# Patient Record
Sex: Female | Born: 1960 | Race: White | Hispanic: No | Marital: Married | Smoking: Former smoker
Health system: Southern US, Community
[De-identification: ages and names within clinical notes are randomized; demographics above are authoritative.]

## PROBLEM LIST (undated history)

## (undated) DIAGNOSIS — J45909 Unspecified asthma, uncomplicated: Secondary | ICD-10-CM

## (undated) DIAGNOSIS — Z9109 Other allergy status, other than to drugs and biological substances: Secondary | ICD-10-CM

## (undated) DIAGNOSIS — F419 Anxiety disorder, unspecified: Secondary | ICD-10-CM

## (undated) DIAGNOSIS — T7840XA Allergy, unspecified, initial encounter: Secondary | ICD-10-CM

## (undated) DIAGNOSIS — F32A Depression, unspecified: Secondary | ICD-10-CM

## (undated) DIAGNOSIS — R32 Unspecified urinary incontinence: Secondary | ICD-10-CM

## (undated) DIAGNOSIS — F329 Major depressive disorder, single episode, unspecified: Secondary | ICD-10-CM

## (undated) DIAGNOSIS — Z86010 Personal history of colonic polyps: Secondary | ICD-10-CM

## (undated) HISTORY — PX: MENISCUS REPAIR: SHX5179

## (undated) HISTORY — DX: Major depressive disorder, single episode, unspecified: F32.9

## (undated) HISTORY — DX: Other allergy status, other than to drugs and biological substances: Z91.09

## (undated) HISTORY — DX: Anxiety disorder, unspecified: F41.9

## (undated) HISTORY — PX: COLONOSCOPY: SHX174

## (undated) HISTORY — DX: Personal history of colonic polyps: Z86.010

## (undated) HISTORY — DX: Depression, unspecified: F32.A

## (undated) HISTORY — DX: Unspecified urinary incontinence: R32

## (undated) HISTORY — DX: Allergy, unspecified, initial encounter: T78.40XA

## (undated) HISTORY — DX: Unspecified asthma, uncomplicated: J45.909

---

## 1999-03-31 ENCOUNTER — Ambulatory Visit (HOSPITAL_COMMUNITY): Admission: RE | Admit: 1999-03-31 | Discharge: 1999-03-31 | Payer: Self-pay

## 1999-04-15 ENCOUNTER — Other Ambulatory Visit: Admission: RE | Admit: 1999-04-15 | Discharge: 1999-04-15 | Payer: Self-pay | Admitting: Obstetrics and Gynecology

## 2000-04-18 ENCOUNTER — Other Ambulatory Visit: Admission: RE | Admit: 2000-04-18 | Discharge: 2000-04-18 | Payer: Self-pay | Admitting: Obstetrics and Gynecology

## 2001-05-15 ENCOUNTER — Other Ambulatory Visit: Admission: RE | Admit: 2001-05-15 | Discharge: 2001-05-15 | Payer: Self-pay | Admitting: Obstetrics and Gynecology

## 2006-05-04 ENCOUNTER — Other Ambulatory Visit: Admission: RE | Admit: 2006-05-04 | Discharge: 2006-05-04 | Payer: Self-pay | Admitting: Gynecology

## 2007-11-14 ENCOUNTER — Other Ambulatory Visit: Admission: RE | Admit: 2007-11-14 | Discharge: 2007-11-14 | Payer: Self-pay | Admitting: Gynecology

## 2007-12-07 HISTORY — PX: OTHER SURGICAL HISTORY: SHX169

## 2008-12-03 ENCOUNTER — Other Ambulatory Visit: Admission: RE | Admit: 2008-12-03 | Discharge: 2008-12-03 | Payer: Self-pay | Admitting: Gynecology

## 2008-12-03 ENCOUNTER — Encounter: Payer: Self-pay | Admitting: Gynecology

## 2008-12-03 ENCOUNTER — Ambulatory Visit: Payer: Self-pay | Admitting: Gynecology

## 2010-02-11 ENCOUNTER — Other Ambulatory Visit: Admission: RE | Admit: 2010-02-11 | Discharge: 2010-02-11 | Payer: Self-pay | Admitting: Gynecology

## 2010-02-11 ENCOUNTER — Ambulatory Visit: Payer: Self-pay | Admitting: Gynecology

## 2010-04-02 ENCOUNTER — Ambulatory Visit: Payer: Self-pay | Admitting: Gynecology

## 2011-03-03 ENCOUNTER — Other Ambulatory Visit: Payer: Self-pay | Admitting: Gynecology

## 2011-03-03 ENCOUNTER — Other Ambulatory Visit (HOSPITAL_COMMUNITY)
Admission: RE | Admit: 2011-03-03 | Discharge: 2011-03-03 | Disposition: A | Discharge status: Home / Self Care | Payer: BC Managed Care – PPO | Source: Ambulatory Visit | Attending: Gynecology | Admitting: Gynecology

## 2011-03-03 ENCOUNTER — Encounter (INDEPENDENT_AMBULATORY_CARE_PROVIDER_SITE_OTHER): Payer: BC Managed Care – PPO | Admitting: Gynecology

## 2011-03-03 DIAGNOSIS — Z833 Family history of diabetes mellitus: Secondary | ICD-10-CM

## 2011-03-03 DIAGNOSIS — Z13220 Encounter for screening for lipoid disorders: Secondary | ICD-10-CM

## 2011-03-03 DIAGNOSIS — Z01419 Encounter for gynecological examination (general) (routine) without abnormal findings: Secondary | ICD-10-CM

## 2011-03-03 DIAGNOSIS — R823 Hemoglobinuria: Secondary | ICD-10-CM

## 2011-03-03 DIAGNOSIS — Z124 Encounter for screening for malignant neoplasm of cervix: Secondary | ICD-10-CM | POA: Insufficient documentation

## 2012-03-27 ENCOUNTER — Other Ambulatory Visit: Payer: Self-pay | Admitting: *Deleted

## 2012-03-27 MED ORDER — SERTRALINE HCL 100 MG PO TABS: 100.0000 mg | ORAL_TABLET | Freq: Every day | ORAL | Status: DC

## 2012-04-17 ENCOUNTER — Other Ambulatory Visit: Payer: Self-pay | Admitting: *Deleted

## 2012-04-17 MED ORDER — SERTRALINE HCL 100 MG PO TABS: 100.0000 mg | ORAL_TABLET | Freq: Every day | ORAL | Status: DC

## 2012-05-13 ENCOUNTER — Other Ambulatory Visit: Payer: Self-pay | Admitting: Gynecology

## 2012-05-29 ENCOUNTER — Other Ambulatory Visit: Payer: Self-pay | Admitting: Gynecology

## 2012-05-30 ENCOUNTER — Other Ambulatory Visit: Payer: Self-pay | Admitting: Gynecology

## 2012-06-14 ENCOUNTER — Ambulatory Visit (INDEPENDENT_AMBULATORY_CARE_PROVIDER_SITE_OTHER): Payer: BC Managed Care – PPO | Admitting: Gynecology

## 2012-06-14 ENCOUNTER — Encounter: Payer: Self-pay | Admitting: Gynecology

## 2012-06-14 VITALS — BP 140/78 | Ht 66.0 in | Wt 157.0 lb

## 2012-06-14 DIAGNOSIS — Z131 Encounter for screening for diabetes mellitus: Secondary | ICD-10-CM

## 2012-06-14 DIAGNOSIS — Z13220 Encounter for screening for lipoid disorders: Secondary | ICD-10-CM

## 2012-06-14 DIAGNOSIS — Z79890 Hormone replacement therapy (postmenopausal): Secondary | ICD-10-CM

## 2012-06-14 DIAGNOSIS — N951 Menopausal and female climacteric states: Secondary | ICD-10-CM

## 2012-06-14 DIAGNOSIS — Z01419 Encounter for gynecological examination (general) (routine) without abnormal findings: Secondary | ICD-10-CM

## 2012-06-14 LAB — CBC WITH DIFFERENTIAL/PLATELET
Basophils Absolute: 0 10*3/uL (ref 0.0–0.1)
Basophils Relative: 0 % (ref 0–1)
Eosinophils Absolute: 0 10*3/uL (ref 0.0–0.7)
Eosinophils Relative: 1 % (ref 0–5)
MCH: 33.2 pg (ref 26.0–34.0)
MCHC: 34.9 g/dL (ref 30.0–36.0)
MCV: 95.1 fL (ref 78.0–100.0)
Neutrophils Relative %: 68 % (ref 43–77)
Platelets: 245 10*3/uL (ref 150–400)
RBC: 3.91 MIL/uL (ref 3.87–5.11)
RDW: 13.4 % (ref 11.5–15.5)

## 2012-06-14 LAB — TSH: TSH: 1.655 u[IU]/mL (ref 0.350–4.500)

## 2012-06-14 LAB — GLUCOSE, RANDOM: Glucose, Bld: 88 mg/dL (ref 70–99)

## 2012-06-14 LAB — FOLLICLE STIMULATING HORMONE: FSH: 4.8 m[IU]/mL

## 2012-06-14 LAB — LIPID PANEL: LDL Cholesterol: 110 mg/dL — ABNORMAL HIGH (ref 0–99)

## 2012-06-14 MED ORDER — SERTRALINE HCL 100 MG PO TABS: 100.0000 mg | ORAL_TABLET | Freq: Every day | ORAL | Status: DC

## 2012-06-14 NOTE — Progress Notes (Signed)
Theresa Merritt 26-Apr-1961 [id number]        51 y.o.  for annual exam.  Several issues noted below.  Past medical history,surgical history, medications, allergies, family history and social history were all reviewed and documented in the EPIC chart. ROS:  Was performed and pertinent positives and negatives are included in the history.  Exam: Biomedical scientist Filed Vitals:   06/14/12 1201  BP: 140/78   General appearance  Normal Skin grossly normal Head/Neck normal with no cervical or supraclavicular adenopathy thyroid normal Lungs  clear Cardiac RR, without RMG Abdominal  soft, nontender, without masses, organomegaly or hernia Breasts  examined lying and sitting without masses, retractions, discharge or axillary adenopathy. Pelvic  Ext/BUS/vagina  normal   Cervix  normal   Uterus  anteverted, normal size, shape and contour, midline and mobile nontender   Adnexa  Without masses or tenderness    Anus and perineum  normal   Rectovaginal  normal sphincter tone without palpated masses or tenderness.    Assessment/Plan:  51 y.o. G3 P15female for annual exam.    1. Menopausal symptoms. Patient having mood swings some hot flushes. Does have regular menses overall although did skip one earlier this year. She had been on bioidentical hormones to a clinic in [place] but this clinic had closed and she is no longer on these. She had been on testosterone and progesterone and said she felt better when she was on these. We'll check baseline FSH TSH. I again reviewed the whole issue of HRT to include the WHI study with increased risk of stroke heart attack DVT breast cancer and endometrial cancer risks. I reviewed the androgen supplement risks to include unfavorable lipid profile weight gain hair growth possible tumors. Possibilities of trial of low-dose Estratest reviewed with or without progesterone assuming she we continue to have spontaneous menses also discussed. We'll await hormone levels and  then we'll go from there. 2. Anxiety. She's having a lot of stressors in her life with anxiety. She is on Zoloft 100 mg pills that she needs to continue this I refilled her times a year. She had been on Xanax and trazodone for sleep in the past and did not refill this at this time but we'll see how she does particularly if we consider hormone replacement. 3. Mammography. Patient is overdue for mammogram and knows to schedule this and agrees to do so. SBE monthly reviewed. 4. Pap smear. Last Pap smear 2012. No Pap smear done this year. No history of abnormal Paps before with several normal reports chart. I reviewed current screening guidelines we'll plan every 3-5 your Pap smears. 5. Colonoscopy. Recommended screening colonoscopy now and gave her names of facilities to call to arrange this. 6. Health maintenance. Patient has had elevated cholesterols in the past but never followed up for a fasting lipid profile. We'll check lipid profile now although is not fasting. If it is remaining elevated at our referred to a primary to follow up with her. Her blood pressure is marginal today at 140/78 in the past to have a recheck on exam situation. Again if it would remain elevated she would need to see a primary also for this. We'll check lipid profile glucose CBC urinalysis along with her TSH and FSH.    Dara Lords MD, 12:37 PM 06/14/2012

## 2012-06-14 NOTE — Patient Instructions (Signed)
Office will follow up with you in reference to your lab work. Schedule mammography as you are overdue. Schedule colonoscopy. If you have a problem doing so let my office know and we can arrange this for you.

## 2012-06-15 LAB — URINALYSIS W MICROSCOPIC + REFLEX CULTURE
Bilirubin Urine: NEGATIVE
Crystals: NONE SEEN
Nitrite: NEGATIVE
Protein, ur: NEGATIVE mg/dL
Specific Gravity, Urine: 1.016 (ref 1.005–1.030)
Urobilinogen, UA: 0.2 mg/dL (ref 0.0–1.0)

## 2012-06-16 ENCOUNTER — Telehealth: Payer: Self-pay | Admitting: Gynecology

## 2012-06-16 MED ORDER — EST ESTROGENS-METHYLTEST 1.25-2.5 MG PO TABS: 1.0000 | ORAL_TABLET | Freq: Every day | ORAL | Status: DC

## 2012-06-16 NOTE — Telephone Encounter (Signed)
Tell patient that we will go ahead and start her on Estratest which is an estrogen/testosterone combination. It is one pill daily. I would like to see her back in 2 months to see how she's doing and I will give her enough for 2 months.  This will give [place] a chance to see how her periods are doing and how she's doing from a symptom relief standpoint.

## 2012-06-19 ENCOUNTER — Encounter: Payer: BC Managed Care – PPO | Admitting: Gynecology

## 2012-06-19 NOTE — Telephone Encounter (Signed)
Pt informed with the below note, pt will start medication once she returns from beach.

## 2013-04-10 ENCOUNTER — Encounter: Payer: Self-pay | Admitting: Internal Medicine

## 2013-05-07 ENCOUNTER — Encounter: Payer: Self-pay | Admitting: Gynecology

## 2013-05-08 ENCOUNTER — Encounter: Payer: Self-pay | Admitting: Internal Medicine

## 2013-05-08 ENCOUNTER — Ambulatory Visit (AMBULATORY_SURGERY_CENTER): Payer: BC Managed Care – PPO

## 2013-05-08 VITALS — Ht 66.0 in | Wt 182.6 lb

## 2013-05-08 DIAGNOSIS — Z1211 Encounter for screening for malignant neoplasm of colon: Secondary | ICD-10-CM

## 2013-05-08 MED ORDER — NA SULFATE-K SULFATE-MG SULF 17.5-3.13-1.6 GM/177ML PO SOLN: 1.0000 | Freq: Once | ORAL | Status: DC

## 2013-05-21 ENCOUNTER — Telehealth: Payer: Self-pay | Admitting: Internal Medicine

## 2013-05-21 NOTE — Telephone Encounter (Signed)
Rx for Suprep resent to [facility].  Pt notified.

## 2013-05-24 ENCOUNTER — Ambulatory Visit (AMBULATORY_SURGERY_CENTER): Payer: BC Managed Care – PPO | Admitting: Internal Medicine

## 2013-05-24 ENCOUNTER — Encounter: Payer: Self-pay | Admitting: Internal Medicine

## 2013-05-24 VITALS — BP 131/75 | HR 59 | Temp 98.6°F | Resp 20 | Ht 66.0 in | Wt 182.0 lb

## 2013-05-24 DIAGNOSIS — K5730 Diverticulosis of large intestine without perforation or abscess without bleeding: Secondary | ICD-10-CM

## 2013-05-24 DIAGNOSIS — Z860100 Personal history of colon polyps, unspecified: Secondary | ICD-10-CM | POA: Insufficient documentation

## 2013-05-24 DIAGNOSIS — D126 Benign neoplasm of colon, unspecified: Secondary | ICD-10-CM

## 2013-05-24 DIAGNOSIS — Z1211 Encounter for screening for malignant neoplasm of colon: Secondary | ICD-10-CM

## 2013-05-24 HISTORY — DX: Personal history of colonic polyps: Z86.010

## 2013-05-24 HISTORY — DX: Personal history of colon polyps, unspecified: Z86.0100

## 2013-05-24 MED ORDER — SODIUM CHLORIDE 0.9 % IV SOLN: 500.0000 mL | INTRAVENOUS | Status: DC

## 2013-05-24 NOTE — Patient Instructions (Addendum)
One tiny polyp was removed. You also have diverticulosis.  Otherwise normal colon - prep was great!  I will let you know pathology results and when to have another routine colonoscopy by mail.  I appreciate the opportunity to care for you. Iva Boop, MD, FACG   YOU HAD AN ENDOSCOPIC PROCEDURE TODAY AT THE [facility]: Refer to the procedure report that was given to you for any specific questions about what was found during the examination.  If the procedure report does not answer your questions, please call your gastroenterologist to clarify.  If you requested that your care partner not be given the details of your procedure findings, then the procedure report has been included in a sealed envelope for you to review at your convenience later.  YOU SHOULD EXPECT: Some feelings of bloating in the abdomen. Passage of more gas than usual.  Walking can help get rid of the air that was put into your GI tract during the procedure and reduce the bloating. If you had a lower endoscopy (such as a colonoscopy or flexible sigmoidoscopy) you may notice spotting of blood in your stool or on the toilet paper. If you underwent a bowel prep for your procedure, then you may not have a normal bowel movement for a few days.  DIET: Your first meal following the procedure should be a light meal and then it is ok to progress to your normal diet.  A half-sandwich or bowl of soup is an example of a good first meal.  Heavy or fried foods are harder to digest and may make you feel nauseous or bloated.  Likewise meals heavy in dairy and vegetables can cause extra gas to form and this can also increase the bloating.  Drink plenty of fluids but you should avoid alcoholic beverages for 24 hours.  ACTIVITY: Your care partner should take you home directly after the procedure.  You should plan to take it easy, moving slowly for the rest of the day.  You can resume normal activity the day after the procedure however  you should NOT DRIVE or use heavy machinery for 24 hours (because of the sedation medicines used during the test).    SYMPTOMS TO REPORT IMMEDIATELY: A gastroenterologist can be reached at any hour.  During normal business hours, 8:30 AM to 5:00 PM Monday through Friday, call [phone].  After hours and on weekends, please call the GI answering service at [phone] who will take a message and have the physician on call contact you.   Following lower endoscopy (colonoscopy or flexible sigmoidoscopy):  Excessive amounts of blood in the stool  Significant tenderness or worsening of abdominal pains  Swelling of the abdomen that is new, acute  Fever of 100F or higher  FOLLOW UP: If any biopsies were taken you will be contacted by phone or by letter within the next 1-3 weeks.  Call your gastroenterologist if you have not heard about the biopsies in 3 weeks.  Our staff will call the home number listed on your records the next business day following your procedure to check on you and address any questions or concerns that you may have at that time regarding the information given to you following your procedure. This is a courtesy call and so if there is no answer at the home number and we have not heard from you through the emergency physician on call, we will assume that you have returned to your regular daily activities without incident.  SIGNATURES/CONFIDENTIALITY: You and/or your care partner have signed paperwork which will be entered into your electronic medical record.  These signatures attest to the fact that that the information above on your After Visit Summary has been reviewed and is understood.  Full responsibility of the confidentiality of this discharge information lies with you and/or your care-partner.  Polyp, Diverticulosis-handouts given  Repeat colonoscopy will be determined by pathology

## 2013-05-24 NOTE — Progress Notes (Signed)
Patient did not experience any of the following events: a burn prior to discharge; a fall within the facility; wrong site/side/patient/procedure/implant event; or a hospital transfer or hospital admission upon discharge from the facility. (G8907) Patient did not have preoperative order for IV antibiotic SSI prophylaxis. (G8918)  

## 2013-05-24 NOTE — Progress Notes (Signed)
Procedure ends, to recovery, VSS. 

## 2013-05-24 NOTE — Op Note (Signed)
[facility] [address] [place], [address]   COLONOSCOPY PROCEDURE REPORT  PATIENT: Theresa Merritt, Theresa Merritt  MR#: [id number] BIRTHDATE: Aug 25, 1961 , 52  yrs. old GENDER: Female ENDOSCOPIST: Iva Boop, MD, [facility] REFERRED XB:JYNWGN Jeannetta Nap, M.D. PROCEDURE DATE:  05/24/2013 PROCEDURE:   Colonoscopy with snare polypectomy ASA CLASS:   Class II INDICATIONS:average risk screening and first colonoscopy. MEDICATIONS: propofol (Diprivan) 300mg  IV, MAC sedation, administered by CRNA, and These medications were titrated to patient response per physician's verbal order  DESCRIPTION OF PROCEDURE:   After the risks benefits and alternatives of the procedure were thoroughly explained, informed consent was obtained.  A digital rectal exam revealed no abnormalities of the rectum.   The LB [id number]  endoscope was introduced through the anus and advanced to the cecum, which was identified by both the appendix and ileocecal valve. No adverse events experienced.   The quality of the prep was excellent using Suprep  The instrument was then slowly withdrawn as the colon was fully examined.     COLON FINDINGS: A sessile polyp measuring 3 mm in size was found in the distal transverse colon.  A polypectomy was performed with a cold snare.  The resection was complete and the polyp tissue was completely retrieved.   Moderate diverticulosis was noted in the sigmoid colon.   The colon mucosa was otherwise normal.   A right colon retroflexion was performed.  Retroflexed views revealed no abnormalities. The time to cecum=2 minutes 54 seconds.  Withdrawal time=11 minutes 19 seconds.  The scope was withdrawn and the procedure completed. COMPLICATIONS: There were no complications.  ENDOSCOPIC IMPRESSION: 1.   Sessile polyp measuring 3 mm in size was found in the distal transverse colon; polypectomy was performed with a cold snare 2.   Moderate diverticulosis was noted in the  sigmoid colon 3.   The colon mucosa was otherwise normal - excellent prep  RECOMMENDATIONS: Timing of repeat colonoscopy will be determined by pathology findings.   eSigned:  Iva Boop, MD, [facility] 05/24/2013 10:19 AM  cc: Windle Guard, MD and The Patient

## 2013-05-24 NOTE — Progress Notes (Signed)
Called to room to assist during endoscopic procedure.  Patient ID and intended procedure confirmed with present staff. Received instructions for my participation in the procedure from the performing physician. ewm 

## 2013-05-25 ENCOUNTER — Telehealth: Payer: Self-pay | Admitting: *Deleted

## 2013-05-25 NOTE — Telephone Encounter (Signed)
  Follow up Call-  Call back number 05/24/2013  Post procedure Call Back phone  # [phone]  Permission to leave phone message Yes     Patient questions:  Do you have a fever, pain , or abdominal swelling? no Pain Score  0 *  Have you tolerated food without any problems? yes  Have you been able to return to your normal activities? yes  Do you have any questions about your discharge instructions: Diet   no Medications  no Follow up visit  no  Do you have questions or concerns about your Care? no  Actions: * If pain score is 4 or above: No action needed, pain <4.

## 2013-05-30 ENCOUNTER — Encounter: Payer: Self-pay | Admitting: Internal Medicine

## 2013-05-30 NOTE — Progress Notes (Signed)
Quick Note:  3 mm tubular adenoma Repeat colon in about 05/2018 ______

## 2014-10-07 ENCOUNTER — Encounter: Payer: Self-pay | Admitting: Internal Medicine

## 2015-08-05 ENCOUNTER — Telehealth: Payer: Self-pay

## 2015-08-05 NOTE — Telephone Encounter (Signed)
Patient called stating she needs referral to urologist. Has appt next week with urologist but needs referral sent.  She saw Dr. Loetta Rough last in 2013 and has a [place] appt scheduled with him on Nov 3rd and wants to see if he will assist her with referral to urologist.  I called her back to find out symptoms requiring urologist. Her phone just rang and no answer or voice mail. I will try again later.

## 2015-08-07 NOTE — Telephone Encounter (Signed)
I did call patient later that same day and got her voice mail. I explained that I needed her to call me back with what symptoms she is having so that I can forward info to Dr. Loetta Rough to see if he will refer her. I told her she can leave details in voice mail if I do not answer.

## 2015-08-13 ENCOUNTER — Encounter: Payer: Self-pay | Admitting: Gynecology

## 2015-10-09 ENCOUNTER — Ambulatory Visit: Payer: Self-pay | Admitting: Gynecology

## 2015-10-23 ENCOUNTER — Encounter: Payer: Self-pay | Admitting: Gynecology

## 2015-10-23 ENCOUNTER — Other Ambulatory Visit (HOSPITAL_COMMUNITY)
Admission: RE | Admit: 2015-10-23 | Discharge: 2015-10-23 | Disposition: A | Discharge status: Home / Self Care | Payer: 59 | Source: Ambulatory Visit | Attending: Gynecology | Admitting: Gynecology

## 2015-10-23 ENCOUNTER — Ambulatory Visit (INDEPENDENT_AMBULATORY_CARE_PROVIDER_SITE_OTHER): Payer: 59 | Admitting: Gynecology

## 2015-10-23 VITALS — BP 128/80 | Ht 66.0 in | Wt 181.0 lb

## 2015-10-23 DIAGNOSIS — Z79890 Hormone replacement therapy (postmenopausal): Secondary | ICD-10-CM | POA: Diagnosis not present

## 2015-10-23 DIAGNOSIS — Z01419 Encounter for gynecological examination (general) (routine) without abnormal findings: Secondary | ICD-10-CM | POA: Diagnosis not present

## 2015-10-23 DIAGNOSIS — Z1151 Encounter for screening for human papillomavirus (HPV): Secondary | ICD-10-CM | POA: Diagnosis present

## 2015-10-23 DIAGNOSIS — Z01411 Encounter for gynecological examination (general) (routine) with abnormal findings: Secondary | ICD-10-CM | POA: Insufficient documentation

## 2015-10-23 DIAGNOSIS — N926 Irregular menstruation, unspecified: Secondary | ICD-10-CM | POA: Diagnosis not present

## 2015-10-23 NOTE — Patient Instructions (Signed)

## 2015-10-23 NOTE — Progress Notes (Signed)
Theresa Merritt 03-22-1961 [id number]        54 y.o.  G3P3  Patient's last menstrual period was 07/23/2015. for annual exam.  Has not been in the office for over 3 years. Several issues noted below.  Past medical history,surgical history, problem list, medications, allergies, family history and social history were all reviewed and documented as reviewed in the EPIC chart.  ROS:  Performed with pertinent positives and negatives included in the history, assessment and plan.   Additional significant findings :  none   Exam: Kim Counsellor Vitals:   10/23/15 1001  BP: 128/80  Height: 5\' 6"  (1.676 m)  Weight: 181 lb (82.101 kg)   General appearance:  Normal affect, orientation and appearance. Skin: Grossly normal HEENT: Without gross lesions.  No cervical or supraclavicular adenopathy. Thyroid normal.  Lungs:  Clear without wheezing, rales or rhonchi Cardiac: RR, without RMG Abdominal:  Soft, nontender, without masses, guarding, rebound, organomegaly or hernia Breasts:  Examined lying and sitting without masses, retractions, discharge or axillary adenopathy. Pelvic:  Ext/BUS/vagina with mild atrophic changes  Cervix with mild atrophic changes. Pap smear/HPV  Uterus anteverted, normal size, shape and contour, midline and mobile nontender   Adnexa  Without masses or tenderness    Anus and perineum  Normal   Rectovaginal  Normal sphincter tone without palpated masses or tenderness.    Assessment/Plan:  54 y.o. G3P3 female for annual exam with irregular menses, vasectomy birth control.   1. HRT/irregular menses. Patient notes that her menses have been very light the point of spotting monthly. She has started to skip over this past year. No prolonged or atypical bleeding. Last menstrual. Reported in August. Was evaluated in [facility] clinic in [place] where they did hormone levels and ultimately started her on HRT to include estradiol 0.5 mg and Prometrium 200 mg nightly. She  reports not having significant symptoms but that she started prophylactically to prevent symptoms. I reviewed the whole issue of HRT with her to include the ACOG and NAMS statements and the WHI study with increased risk of stroke heart attack DVT and breast cancer. Lack of evidence that hormonal level blood testing is of any benefit in the menopausal management.  Not recommended as a prophylactic treatment.  After a lengthy discussion she understands the issues and she is going to continue to follow up with them for management of her HRT. If she has prolonged or atypical bleeding she understands the need for evaluation. 2. Pap smear 2012. Pap smear/HPV today. No history of abnormal Pap smears previously. 3. Mammography 08/2015. Continue with annual mammography when due. SBE monthly reviewed. 4. Colonoscopy 2014. Repeat at their recommended interval. 5. DEXA never. Will plan further into the menopause. 6. Health maintenance. No routine lab work done as patient reports this all done through her primary physician's office. She does note several episodes of a rapid heartbeat over the last month or so. No chest pain, lightheadedness radiation nausea vomiting. No association with activity.  Cardiac exam is normal today. I recommended she call her primary physician to arrange evaluation to include EKG/possible Holter. She agrees to call and see him in follow up.  Patient will follow up with me in one year and continue to follow up with the [facility] clinic for hormone management.   Theresa Auerbach MD, 10:52 AM 10/23/2015

## 2015-10-23 NOTE — Addendum Note (Signed)
Addended by: Nelva Nay on: 10/23/2015 11:06 AM   Modules accepted: Orders

## 2015-10-27 LAB — CYTOLOGY - PAP

## 2015-10-28 ENCOUNTER — Other Ambulatory Visit: Payer: Self-pay | Admitting: Gynecology

## 2015-10-28 MED ORDER — FLUCONAZOLE 150 MG PO TABS: 150.0000 mg | ORAL_TABLET | Freq: Once | ORAL | Status: DC

## 2015-11-20 ENCOUNTER — Ambulatory Visit (INDEPENDENT_AMBULATORY_CARE_PROVIDER_SITE_OTHER): Payer: 59

## 2015-11-20 ENCOUNTER — Encounter: Payer: Self-pay | Admitting: Internal Medicine

## 2015-11-20 ENCOUNTER — Ambulatory Visit (INDEPENDENT_AMBULATORY_CARE_PROVIDER_SITE_OTHER): Payer: 59 | Admitting: Internal Medicine

## 2015-11-20 VITALS — BP 150/86 | HR 67 | Ht 66.5 in | Wt 182.1 lb

## 2015-11-20 DIAGNOSIS — R000 Tachycardia, unspecified: Secondary | ICD-10-CM

## 2015-11-20 NOTE — Patient Instructions (Signed)
Your physician recommends that you continue on your current medications as directed. Please refer to the Current Medication list given to you today. Your physician has recommended that you wear an event monitor. Event monitors are medical devices that record the heart's electrical activity. Doctors most often us these monitors to diagnose arrhythmias. Arrhythmias are problems with the speed or rhythm of the heartbeat. The monitor is a small, portable device. You can wear one while you do your normal daily activities. This is usually used to diagnose what is causing palpitations/syncope (passing out).  Follow up with your physician will depend on test results.   

## 2015-11-20 NOTE — Progress Notes (Signed)
Cardiology Office Note   Date:  11/20/2015   ID:  THEODORE BARTNICKI, DOB [birth year], MRN [id number]  PCP:  Leonard Downing, MD  Cardiologist:   Dorris Carnes, MD   Pt presents for evaluation of heart racing     History of Present Illness: KANNON GARVEY is a 54 y.o. female with no prior heart problms  about 6 wks ago woke up  Heart racing  Pulse in  120s  Laid down  Went back to sleep Happens during the day   No dizziness  No SOB    WHen not having she feels good  Breathing good    Training for 5 k   No problmes          Current Outpatient Prescriptions  Medication Sig Dispense Refill  . ALPRAZolam (XANAX) 0.25 MG tablet Take 0.25 mg by mouth daily.     Marland Kitchen escitalopram (LEXAPRO) 20 MG tablet Take 20 mg by mouth daily.    Marland Kitchen estradiol (ESTRACE) 0.5 MG tablet Take 0.5 mg by mouth daily.    . montelukast (SINGULAIR) 10 MG tablet Take 10 mg by mouth at bedtime.    . nitrofurantoin, macrocrystal-monohydrate, (MACROBID) 100 MG capsule Take 100 mg by mouth as needed.     Marland Kitchen PRESCRIPTION MEDICATION Thyroid 3/4 gram    . progesterone (PROMETRIUM) 200 MG capsule Take 200 mg by mouth daily.    . traZODone (DESYREL) 100 MG tablet Take 100 mg by mouth at bedtime.     No current facility-administered medications for this visit.    Allergies:   Review of patient's allergies indicates no known allergies.   Past Medical History  Diagnosis Date  . Environmental allergies   . Anxiety   . Asthma     as child  . Personal history of colonic adenoma 05/24/2013    Past Surgical History  Procedure Laterality Date  . Cesarean section      x2  . Her option ablation  12/2007  . Meniscus repair       Social History:  The patient  reports that she quit smoking about 36 years ago. Her smoking use included Cigarettes. She has never used smokeless tobacco. She reports that she drinks about 12.0 oz of alcohol per week. She reports that she does not use illicit drugs.   Family History:   The patient's family history includes Heart disease in her father; Hypertension in her mother.    ROS:  Please see the history of present illness. All other systems are reviewed and  Negative to the above problem except as noted.    PHYSICAL EXAM: VS:  BP 150/86 mmHg  Pulse 67  Ht 5' 6.5" (1.689 m)  Wt 82.609 kg (182 lb 1.9 oz)  BMI 28.96 kg/m2  LMP 07/23/2015  GEN: Well nourished, well developed, in no acute distress HEENT: normal Neck: no JVD, carotid bruits, or masses Cardiac: RRR; no murmurs, rubs, or gallops,no edema  Respiratory:  clear to auscultation bilaterally, normal work of breathing GI: soft, nontender, nondistended, + BS  No hepatomegaly  MS: no deformity Moving all extremities   Skin: warm and dry, no rash Neuro:  Strength and sensation are intact Psych: euthymic mood, full affect   EKG:  EKG is ordered today.  SR 67  Septal MI     Lipid Panel    Component Value Date/Time   CHOL 245* 06/14/2012 1237   TRIG 68 06/14/2012 1237   HDL 121 06/14/2012 1237   CHOLHDL  2.0 06/14/2012 1237   VLDL 14 06/14/2012 1237   LDLCALC 110* 06/14/2012 1237      Wt Readings from Last 3 Encounters:  11/20/15 82.609 kg (182 lb 1.9 oz)  10/23/15 82.101 kg (181 lb)  05/24/13 82.555 kg (182 lb)      ASSESSMENT AND PLAN:  1  Palpitations  Will set up for an event monitor  OVerall spells do not appear to be hemodynamiclaly destabiling    2.  HTN  Follow   A little high today  F/U depends on test results     Signed, Dorris Carnes, MD  11/20/2015 12:28 PM    [address], [place], [place]  [address] Phone: [phone]; Fax: [phone]

## 2015-12-25 ENCOUNTER — Telehealth: Payer: Self-pay | Admitting: Internal Medicine

## 2015-12-25 ENCOUNTER — Other Ambulatory Visit: Payer: Self-pay | Admitting: *Deleted

## 2015-12-25 MED ORDER — METOPROLOL SUCCINATE ER 25 MG PO TB24: 25.0000 mg | ORAL_TABLET | Freq: Every day | ORAL | Status: DC

## 2015-12-25 NOTE — Telephone Encounter (Signed)
Left a message for the patient to call back.  

## 2015-12-25 NOTE — Telephone Encounter (Signed)
Results given.

## 2015-12-25 NOTE — Telephone Encounter (Signed)
Follow Up ° °Pt returned call//  °

## 2015-12-25 NOTE — Telephone Encounter (Signed)
PT [place] RESULTS--PLS CALL [phone]

## 2017-05-06 ENCOUNTER — Encounter: Payer: Self-pay | Admitting: Gynecology

## 2017-05-06 ENCOUNTER — Ambulatory Visit (INDEPENDENT_AMBULATORY_CARE_PROVIDER_SITE_OTHER): Payer: BLUE CROSS/BLUE SHIELD | Admitting: Gynecology

## 2017-05-06 VITALS — BP 124/84

## 2017-05-06 DIAGNOSIS — N898 Other specified noninflammatory disorders of vagina: Secondary | ICD-10-CM | POA: Diagnosis not present

## 2017-05-06 LAB — WET PREP FOR TRICH, YEAST, CLUE
CLUE CELLS WET PREP: NONE SEEN
Trich, Wet Prep: NONE SEEN
WBC, Wet Prep HPF POC: NONE SEEN
Yeast Wet Prep HPF POC: NONE SEEN

## 2017-05-06 MED ORDER — BETAMETHASONE DIPROPIONATE 0.05 % EX CREA: TOPICAL_CREAM | Freq: Two times a day (BID) | CUTANEOUS | 0 refills | Status: DC

## 2017-05-06 MED ORDER — FLUCONAZOLE 200 MG PO TABS: 200.0000 mg | ORAL_TABLET | Freq: Every day | ORAL | 0 refills | Status: DC

## 2017-05-06 NOTE — Progress Notes (Signed)
    MALISSIA RABBANI October 30, 1961 [id number]        56 y.o.  G3P3 presents complaining of 2 weeks worth of vaginal irritation. Patient was at the beach and noticed a discharge with a lot of itching and irritation. Used 2 rounds of OTC antifungal with mild improvement but still with a lot of external irritation. No urinary symptoms such as frequency dysuria or urgency low back pain fever or chills.  Past medical history,surgical history, problem list, medications, allergies, family history and social history were all reviewed and documented in the EPIC chart.  Directed ROS with pertinent positives and negatives documented in the history of present illness/assessment and plan.  Exam: Caryn Bee assistant Vitals:   05/06/17 1413  BP: 124/84   General appearance:  Normal Abdomen soft nontender without masses guarding rebound Pelvic external BUS vagina with generalized vulvitis no specific lesions. Scant white discharge noted. Cervix normal. Uterus grossly normal midline mobile nontender. Adnexa without masses or tenderness.  Assessment/Plan:  56 y.o. G3P3 with history and exam as above. Wet prep is unremarkable. 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 We'll treat with Diflucan 200 mg daily 5 days and diprolene 0.05% cream twice daily for the irritation. Follow up if symptoms persist, worsen or recur.     Anastasio Auerbach MD, 2:31 PM 05/06/2017

## 2017-05-06 NOTE — Patient Instructions (Signed)
Take the Diflucan pill daily for 5 days Use the Diprolene cream on the outside of the vagina for the irritation twice daily. Follow up if the symptoms persist, worsen or recur.

## 2017-05-27 ENCOUNTER — Other Ambulatory Visit: Payer: Self-pay | Admitting: Orthopedic Surgery

## 2017-05-27 DIAGNOSIS — R52 Pain, unspecified: Secondary | ICD-10-CM

## 2017-06-01 ENCOUNTER — Ambulatory Visit
Admission: RE | Admit: 2017-06-01 | Discharge: 2017-06-01 | Disposition: A | Discharge status: Home / Self Care | Payer: BLUE CROSS/BLUE SHIELD | Source: Ambulatory Visit | Attending: Orthopedic Surgery | Admitting: Orthopedic Surgery

## 2017-06-01 DIAGNOSIS — R52 Pain, unspecified: Secondary | ICD-10-CM

## 2017-06-14 ENCOUNTER — Encounter: Payer: Self-pay | Admitting: Gynecology

## 2017-06-14 ENCOUNTER — Ambulatory Visit (INDEPENDENT_AMBULATORY_CARE_PROVIDER_SITE_OTHER): Payer: BLUE CROSS/BLUE SHIELD | Admitting: Gynecology

## 2017-06-14 VITALS — BP 120/76 | Ht 67.0 in | Wt 215.0 lb

## 2017-06-14 DIAGNOSIS — Z01411 Encounter for gynecological examination (general) (routine) with abnormal findings: Secondary | ICD-10-CM | POA: Diagnosis not present

## 2017-06-14 DIAGNOSIS — N952 Postmenopausal atrophic vaginitis: Secondary | ICD-10-CM | POA: Diagnosis not present

## 2017-06-14 NOTE — Progress Notes (Signed)
    Theresa Merritt 08-29-1961 [id number]        56 y.o.  G3P3 for annual exam.    Past medical history,surgical history, problem list, medications, allergies, family history and social history were all reviewed and documented as reviewed in the EPIC chart.  ROS:  Performed with pertinent positives and negatives included in the history, assessment and plan.   Additional significant findings :  None   Exam: Caryn Bee assistant Vitals:   06/14/17 0859  BP: 120/76  Weight: 215 lb (97.5 kg)  Height: 5\' 7"  (1.702 m)   Body mass index is 33.67 kg/m.  General appearance:  Normal affect, orientation and appearance. Skin: Grossly normal HEENT: Without gross lesions.  No cervical or supraclavicular adenopathy. Thyroid normal.  Lungs:  Clear without wheezing, rales or rhonchi Cardiac: RR, without RMG Abdominal:  Soft, nontender, without masses, guarding, rebound, organomegaly or hernia Breasts:  Examined lying and sitting without masses, retractions, discharge or axillary adenopathy. Pelvic:  Ext, BUS, Vagina: Normal  Cervix: Normal  Uterus: Anteverted, normal size, shape and contour, midline and mobile nontender   Adnexa: Without masses or tenderness    Anus and perineum: Normal   Rectovaginal: Normal sphincter tone without palpated masses or tenderness.    Assessment/Plan:  56 y.o. G3P3 female for annual exam.  1. Postmenopausal/HRT. Patient continues on a bioequivalent HRT regiment through another clinic. Reports doing well with this and seeing them on a regular basis.  It includes a mixture of " bioidentical hormones" and Prometrium 100 mg daily. She's done no bleeding. I reviewed with her the whole issue of a bioequivalent hormones and lack of long-term prospective randomized data, standardization's in preparation and lack of data to support doing hormone testing to determine dosage. Patient is comfortable continuing with this clinic and will do so at her choice. Need to report  any vaginal bleeding stressed. 2. Pap smear/HPV 10/2015. No Pap smear done today. No history of significant abnormal Pap smears previously. 3. Mammography 08/2015. Need to schedule baseline mammogram now reviewed and patient agrees to do so. Breast exam normal today. SBE monthly reviewed. 4. Colonoscopy 2014. Repeat at their recommended interval. 5. DEXA never. Will plan further into the menopause. 6. Health maintenance. Patient recently quit drinking and is attending [employer] meetings. I applauded her efforts and encouraged her to continue. No routine lab work done as patient reports this done elsewhere. Follow up in one year, sooner as needed.     Anastasio Auerbach MD, 9:43 AM 06/14/2017

## 2017-06-14 NOTE — Patient Instructions (Signed)
Schedule your mammogram. Follow up in one year for annual exam, sooner if any issues.

## 2018-07-12 IMAGING — MR MR KNEE*L* W/O CM
5 of 6 series · 33 of 40 positions shown · non-contrast
Comparison: None.

CLINICAL DATA: Medial left knee pain. Medial left knee pain since
dog jumped on knee 3 weeks ago.

EXAM:
MRI OF THE LEFT KNEE WITHOUT CONTRAST
TECHNIQUE: Multiplanar, multisequence MR imaging of the knee was performed. No
intravenous contrast was administered.

[Series 6: PD fat-sat · axial · left · 3.0mm · 0.39mm/px · z∈[-71,+40]mm · 7 of 32 slices shown (1 of 3)]
[im 1/32]
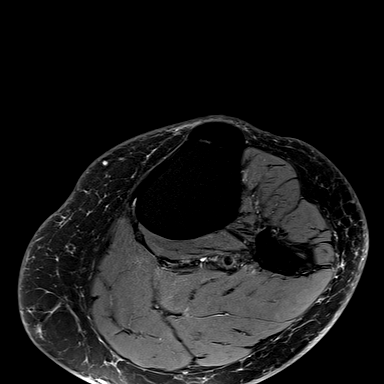
[im 6/32]
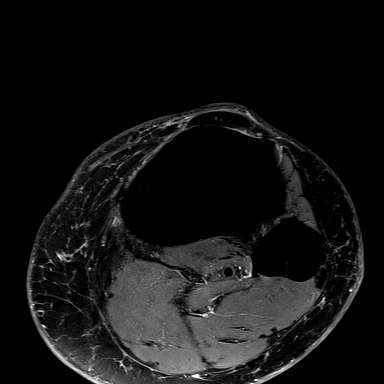
[im 11/32]
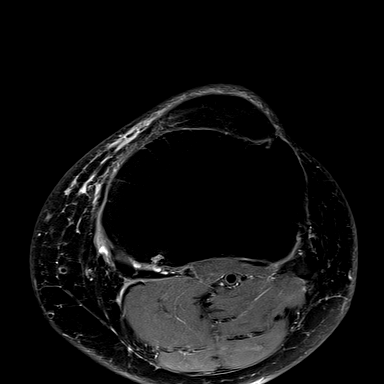
[im 16/32]
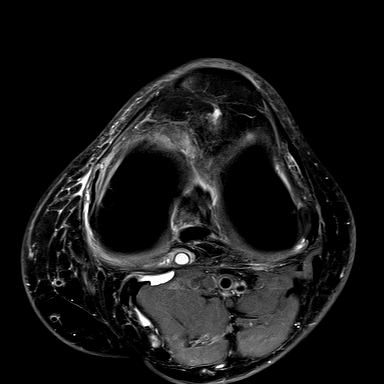
[im 21/32]
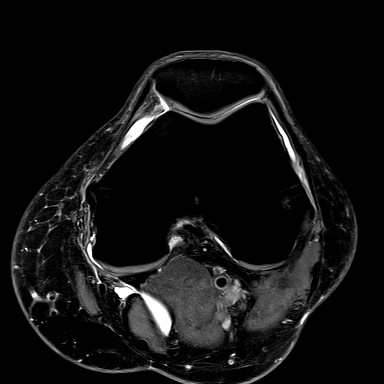
[im 26/32]
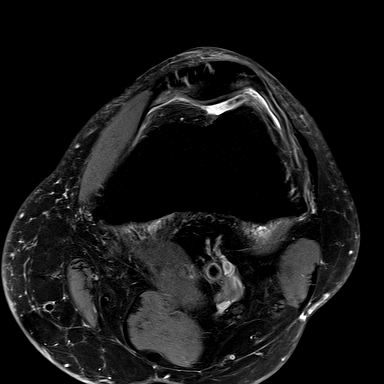
[im 32/32]
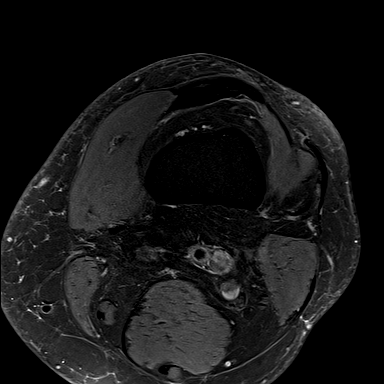

[Series 8: PD fat-sat · coronal · left · 3.0mm · 0.33mm/px · 7 of 26 slices shown (2 of 3)]
[im 1/26]
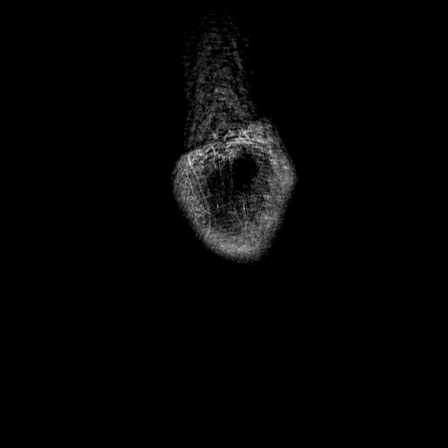
[im 5/26]
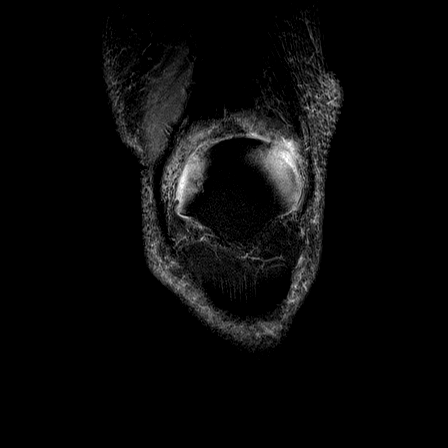
[im 9/26]
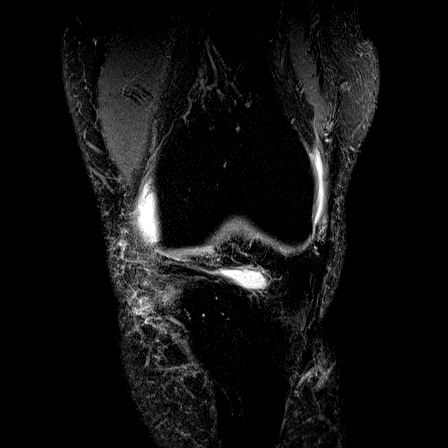
[im 13/26]
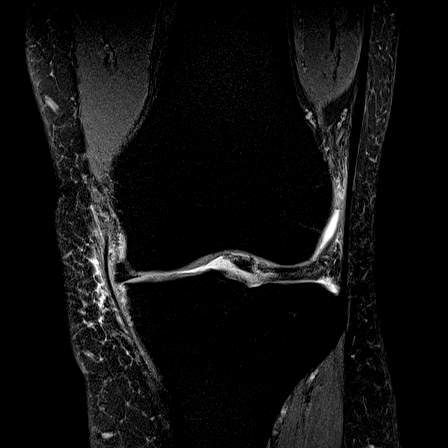
[im 17/26]
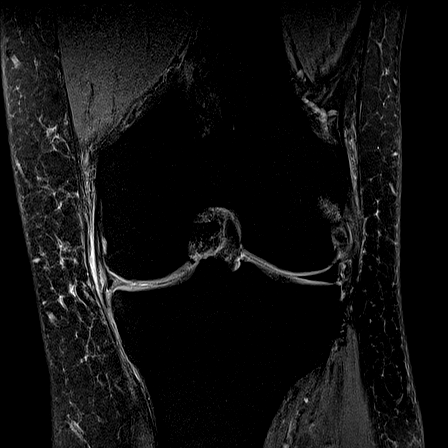
[im 21/26]
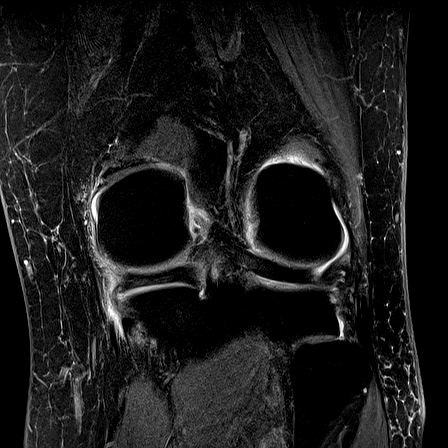
[im 26/26]
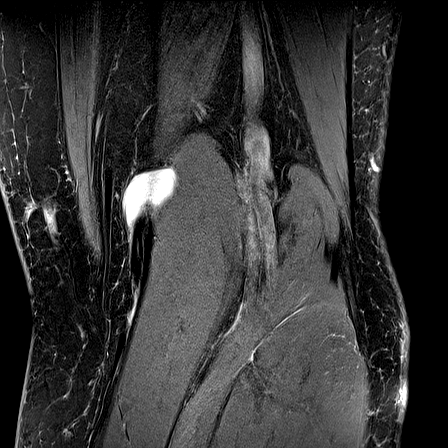

[Series 9: PD fat-sat · sagittal · left · 3.0mm · 0.39mm/px · 7 of 28 slices shown (3 of 3)]
[im 1/28]
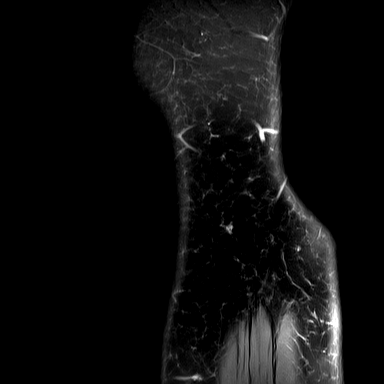
[im 5/28]
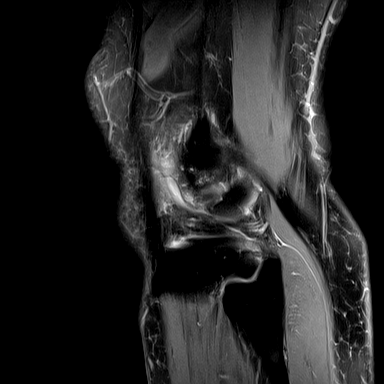
[im 10/28]
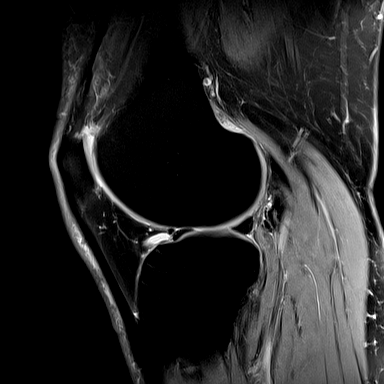
[im 14/28]
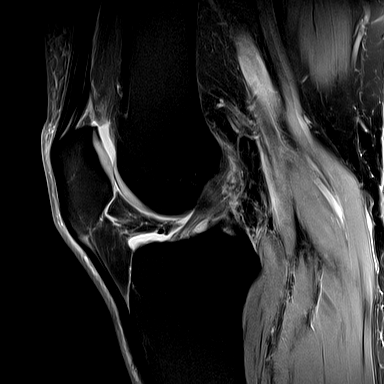
[im 19/28]
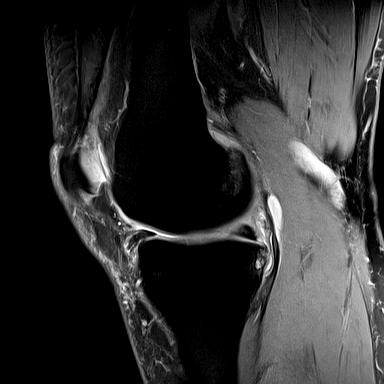
[im 23/28]
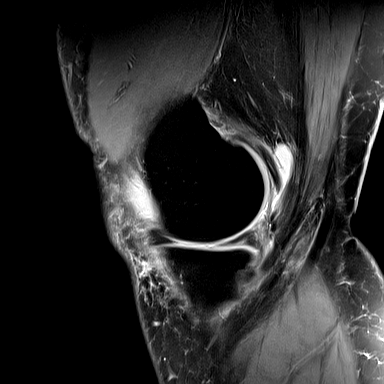
[im 28/28]
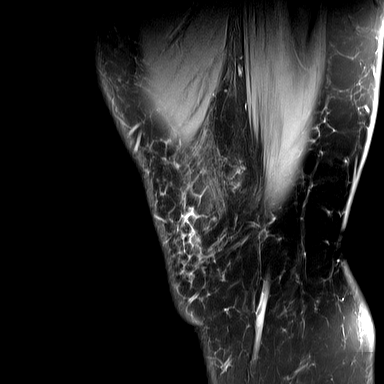

[Series 10: T2 fat-sat · coronal · left · 3.0mm · 0.39mm/px · 7 of 26 slices shown]
[im 1/26]
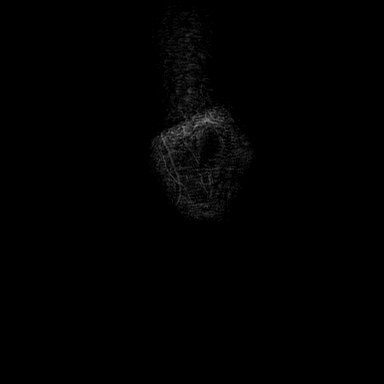
[im 5/26]
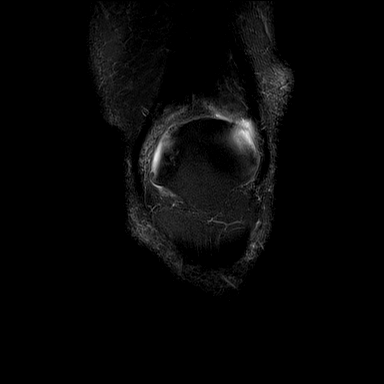
[im 9/26]
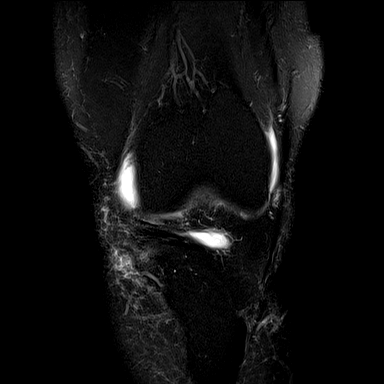
[im 13/26]
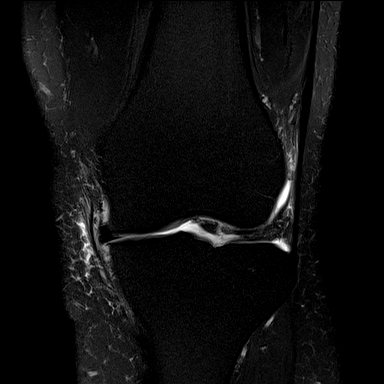
[im 17/26]
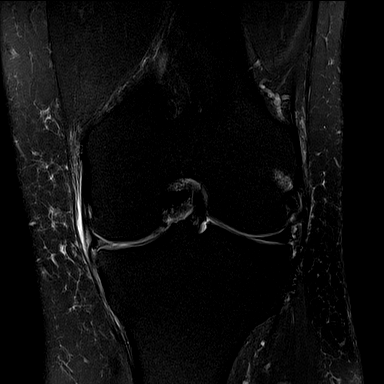
[im 21/26]
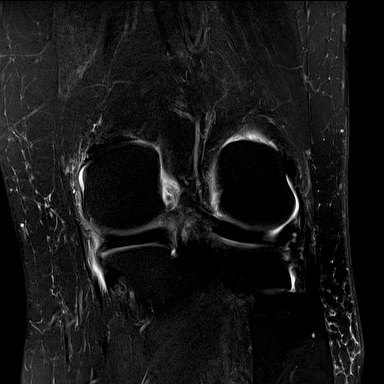
[im 26/26]
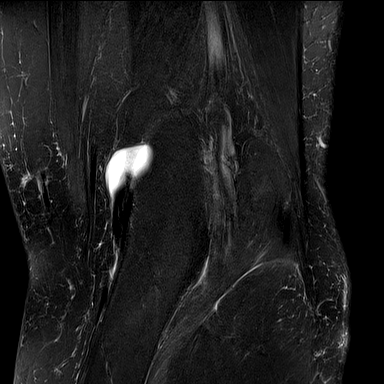

[Series 11: PD · coronal · left · 1.5mm · 0.44mm/px · 5 of 21 slices shown]
[im 1/21]
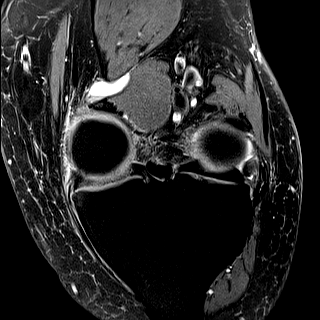
[im 6/21]
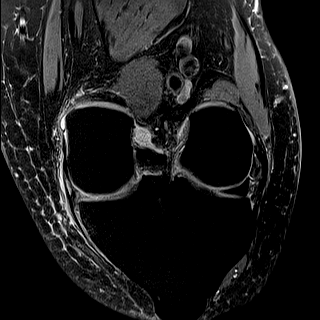
[im 11/21]
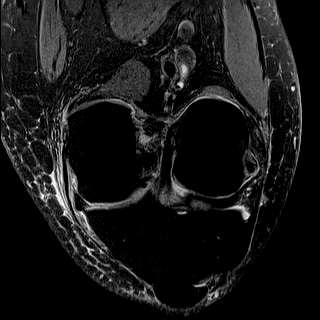
[im 16/21]
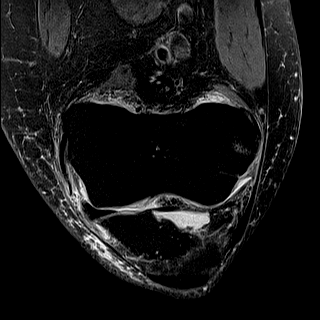
[im 21/21]
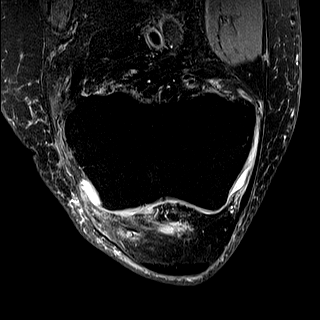

[33 of 40 positions shown; findings below may reference images not displayed]

FINDINGS: MENISCI

Medial meniscus: Radial tear of the body of the medial meniscus.
Oblique tear of the posterior horn of the medial meniscus extending
to the inferior articular surface.

Lateral meniscus:  Intact.

LIGAMENTS

Cruciates:  Intact ACL and PCL.

Collaterals: Soft tissue edema superficial and deep to the MCL with
the MCL otherwise intact. Intact lateral collateral ligament
complex.

CARTILAGE

Patellofemoral: Severe focal cartilage fissuring of the medial
patellar facet.

Medial: Mild partial-thickness cartilage loss of the medial
femorotibial compartment.

Lateral:  No chondral defect.

Joint: No with joint effusion. Mild edema in Hoffa's fat. No plical
thickening.

Popliteal Fossa: Small Baker cyst. Mild tendinosis of the popliteus
tendon at its insertion.

Extensor Mechanism: Intact quadriceps tendon. Mild focal tendinosis
of the proximal patellar tendon.

Bones: Mild subcortical reactive marrow edema at the popliteus
tendon origin. No acute osseous abnormality no fracture dislocation.

Other: No fluid collection hematoma.
IMPRESSION: 1. Radial tear of the body of the medial meniscus. Oblique tear of
the posterior horn of the medial meniscus extending to the inferior
articular surface.
2. Severe focal cartilage fissuring of the medial patellar facet.
3. Mild partial-thickness cartilage loss of the medial femorotibial
compartment.
4. Mild tendinosis of the popliteus tendon at its insertion.

## 2018-07-24 ENCOUNTER — Encounter: Payer: Self-pay | Admitting: Internal Medicine

## 2018-10-17 ENCOUNTER — Encounter: Payer: Self-pay | Admitting: Internal Medicine

## 2018-10-31 ENCOUNTER — Encounter: Payer: Self-pay | Admitting: Gynecology

## 2018-11-14 ENCOUNTER — Ambulatory Visit (AMBULATORY_SURGERY_CENTER): Payer: Self-pay

## 2018-11-14 ENCOUNTER — Encounter: Payer: Self-pay | Admitting: Internal Medicine

## 2018-11-14 VITALS — Ht 66.0 in | Wt 207.2 lb

## 2018-11-14 DIAGNOSIS — Z86010 Personal history of colonic polyps: Secondary | ICD-10-CM

## 2018-11-14 NOTE — Progress Notes (Signed)
Denies allergies to eggs or soy products. Denies complication of anesthesia or sedation. Denies use of weight loss medication. Denies use of O2.   Emmi instructions declined.  

## 2018-11-23 ENCOUNTER — Encounter: Payer: Self-pay | Admitting: Internal Medicine

## 2018-11-23 ENCOUNTER — Ambulatory Visit (AMBULATORY_SURGERY_CENTER): Payer: BLUE CROSS/BLUE SHIELD | Admitting: Internal Medicine

## 2018-11-23 VITALS — BP 155/81 | HR 61 | Temp 98.0°F | Resp 17 | Ht 67.0 in | Wt 215.0 lb

## 2018-11-23 DIAGNOSIS — K635 Polyp of colon: Secondary | ICD-10-CM

## 2018-11-23 DIAGNOSIS — Z86010 Personal history of colonic polyps: Secondary | ICD-10-CM

## 2018-11-23 DIAGNOSIS — D125 Benign neoplasm of sigmoid colon: Secondary | ICD-10-CM

## 2018-11-23 DIAGNOSIS — D122 Benign neoplasm of ascending colon: Secondary | ICD-10-CM | POA: Diagnosis not present

## 2018-11-23 DIAGNOSIS — D123 Benign neoplasm of transverse colon: Secondary | ICD-10-CM | POA: Diagnosis not present

## 2018-11-23 MED ORDER — SODIUM CHLORIDE 0.9 % IV SOLN: 500.0000 mL | Freq: Once | INTRAVENOUS | Status: AC

## 2018-11-23 NOTE — Progress Notes (Signed)
To PACU, VSS. Report to [email or web address] 

## 2018-11-23 NOTE — Patient Instructions (Addendum)
   I found and removed 3 tiny polyps.  I will let you know pathology results and when to have another routine colonoscopy by mail and/or My Chart.  I appreciate the opportunity to care for you. Gatha Mayer, MD, [facility]  Polyp handout given to patient. Diverticulosis handout given to patient.  YOU HAD AN ENDOSCOPIC PROCEDURE TODAY AT [facility]:   Refer to the procedure report that was given to you for any specific questions about what was found during the examination.  If the procedure report does not answer your questions, please call your gastroenterologist to clarify.  If you requested that your care partner not be given the details of your procedure findings, then the procedure report has been included in a sealed envelope for you to review at your convenience later.  YOU SHOULD EXPECT: Some feelings of bloating in the abdomen. Passage of more gas than usual.  Walking can help get rid of the air that was put into your GI tract during the procedure and reduce the bloating. If you had a lower endoscopy (such as a colonoscopy or flexible sigmoidoscopy) you may notice spotting of blood in your stool or on the toilet paper. If you underwent a bowel prep for your procedure, you may not have a normal bowel movement for a few days.  Please Note:  You might notice some irritation and congestion in your nose or some drainage.  This is from the oxygen used during your procedure.  There is no need for concern and it should clear up in a day or so.  SYMPTOMS TO REPORT IMMEDIATELY:   Following lower endoscopy (colonoscopy or flexible sigmoidoscopy):  Excessive amounts of blood in the stool  Significant tenderness or worsening of abdominal pains  Swelling of the abdomen that is new, acute  Fever of 100F or higher For urgent or emergent issues, a gastroenterologist can be reached at any hour by calling [phone].   DIET:  We do recommend a small meal at first, but then  you may proceed to your regular diet.  Drink plenty of fluids but you should avoid alcoholic beverages for 24 hours.  ACTIVITY:  You should plan to take it easy for the rest of today and you should NOT DRIVE or use heavy machinery until tomorrow (because of the sedation medicines used during the test).    FOLLOW UP: Our staff will call the number listed on your records the next business day following your procedure to check on you and address any questions or concerns that you may have regarding the information given to you following your procedure. If we do not reach you, we will leave a message.  However, if you are feeling well and you are not experiencing any problems, there is no need to return our call.  We will assume that you have returned to your regular daily activities without incident.  If any biopsies were taken you will be contacted by phone or by letter within the next 1-3 weeks.  Please call us at [phone] if you have not heard about the biopsies in 3 weeks.    SIGNATURES/CONFIDENTIALITY: You and/or your care partner have signed paperwork which will be entered into your electronic medical record.  These signatures attest to the fact that that the information above on your After Visit Summary has been reviewed and is understood.  Full responsibility of the confidentiality of this discharge information lies with you and/or your care-partner.

## 2018-11-23 NOTE — Op Note (Signed)
[facility] Patient Name: Theresa Merritt Procedure Date: 11/23/2018 2:59 PM MRN: [id number] Endoscopist: Gatha Mayer , MD Age: 57 Referring MD:  Date of Birth: 23-Jun-1961 Gender: Female Account #: [id number] Procedure:                Colonoscopy Indications:              Surveillance: Personal history of adenomatous                            polyps on last colonoscopy 5 years ago Medicines:                Propofol per Anesthesia, Monitored Anesthesia Care Procedure:                Pre-Anesthesia Assessment:                           - Prior to the procedure, a History and Physical                            was performed, and patient medications and                            allergies were reviewed. The patient's tolerance of                            previous anesthesia was also reviewed. The risks                            and benefits of the procedure and the sedation                            options and risks were discussed with the patient.                            All questions were answered, and informed consent                            was obtained. Prior Anticoagulants: The patient has                            taken no previous anticoagulant or antiplatelet                            agents. ASA Grade Assessment: II - A patient with                            mild systemic disease. After reviewing the risks                            and benefits, the patient was deemed in                            satisfactory condition to undergo the procedure.  After obtaining informed consent, the colonoscope                            was passed under direct vision. Throughout the                            procedure, the patient's blood pressure, pulse, and                            oxygen saturations were monitored continuously. The                            Colonoscope was introduced through the anus and   advanced to the the cecum, identified by                            appendiceal orifice and ileocecal valve. The                            colonoscopy was somewhat difficult due to                            significant looping. Successful completion of the                            procedure was aided by applying abdominal pressure.                            The patient tolerated the procedure well. The                            quality of the bowel preparation was good. The                            ileocecal valve, appendiceal orifice, and rectum                            were photographed. The bowel preparation used was                            Miralax. Scope In: 3:11:44 PM Scope Out: 3:29:14 PM Scope Withdrawal Time: 0 hours 11 minutes 15 seconds  Total Procedure Duration: 0 hours 17 minutes 30 seconds  Findings:                 The perianal and digital rectal examinations were                            normal.                           Three sessile polyps were found in the sigmoid                            colon, transverse colon and ascending colon. The  polyps were diminutive in size. These polyps were                            removed with a cold snare. Resection and retrieval                            were complete. Verification of patient                            identification for the specimen was done. Estimated                            blood loss was minimal.                           Multiple small and large-mouthed diverticula were                            found in the sigmoid colon and descending colon.                           The exam was otherwise without abnormality on                            direct and retroflexion views. Complications:            No immediate complications. Impression:               - Three diminutive polyps in the sigmoid colon, in                            the transverse colon and in the ascending  colon,                            removed with a cold snare. Resected and retrieved.                           - Diverticulosis in the sigmoid colon and in the                            descending colon.                           - The examination was otherwise normal on direct                            and retroflexion views.                           - Personal history of colonic polyps - diminutive                            adenoma 2014. Recommendation:           - Patient has a contact number available for  emergencies. The signs and symptoms of potential                            delayed complications were discussed with the                            patient. Return to normal activities tomorrow.                            Written discharge instructions were provided to the                            patient.                           - Resume previous diet.                           - Continue present medications.                           - Repeat colonoscopy is recommended for                            surveillance. The colonoscopy date will be                            determined after pathology results from today's                            exam become available for review. Gatha Mayer, MD 11/23/2018 3:39:39 PM This report has been signed electronically.

## 2018-11-23 NOTE — Progress Notes (Signed)
Called to room to assist during endoscopic procedure.  Patient ID and intended procedure confirmed with present staff. Received instructions for my participation in the procedure from the performing physician.  

## 2018-11-24 ENCOUNTER — Telehealth: Payer: Self-pay

## 2018-11-24 NOTE — Telephone Encounter (Signed)
  Follow up Call-  Call back number 11/23/2018  Post procedure Call Back phone  # [phone]  Permission to leave phone message Yes  Some recent data might be hidden     Patient questions:  Do you have a fever, pain , or abdominal swelling? No. Pain Score  0 *  Have you tolerated food without any problems? Yes.    Have you been able to return to your normal activities? Yes.    Do you have any questions about your discharge instructions: Diet   No. Medications  No. Follow up visit  No.  Do you have questions or concerns about your Care? No.  Actions: * If pain score is 4 or above: No action needed, pain <4.

## 2018-12-04 ENCOUNTER — Encounter: Payer: Self-pay | Admitting: Internal Medicine

## 2018-12-04 NOTE — Progress Notes (Signed)
3 diminutive adenomas - recall 2022-3 \\My  Chart letter

## 2018-12-07 ENCOUNTER — Encounter: Payer: BLUE CROSS/BLUE SHIELD | Admitting: Gynecology

## 2018-12-26 ENCOUNTER — Encounter: Payer: Self-pay | Admitting: Gynecology

## 2018-12-26 ENCOUNTER — Ambulatory Visit: Payer: BLUE CROSS/BLUE SHIELD | Admitting: Gynecology

## 2018-12-26 VITALS — BP 118/78 | Ht 66.0 in | Wt 206.0 lb

## 2018-12-26 DIAGNOSIS — Z79890 Hormone replacement therapy (postmenopausal): Secondary | ICD-10-CM | POA: Diagnosis not present

## 2018-12-26 DIAGNOSIS — N952 Postmenopausal atrophic vaginitis: Secondary | ICD-10-CM

## 2018-12-26 DIAGNOSIS — N951 Menopausal and female climacteric states: Secondary | ICD-10-CM

## 2018-12-26 DIAGNOSIS — Z01419 Encounter for gynecological examination (general) (routine) without abnormal findings: Secondary | ICD-10-CM | POA: Diagnosis not present

## 2018-12-26 LAB — TSH: TSH: 2.8 mIU/L (ref 0.40–4.50)

## 2018-12-26 MED ORDER — ESTRADIOL 0.5 MG PO TABS: 0.5000 mg | ORAL_TABLET | Freq: Every day | ORAL | 12 refills | Status: DC

## 2018-12-26 MED ORDER — PROGESTERONE MICRONIZED 100 MG PO CAPS: 100.0000 mg | ORAL_CAPSULE | Freq: Every day | ORAL | 12 refills | Status: DC

## 2018-12-26 NOTE — Patient Instructions (Signed)
Start on the hormones as we discussed.  Call if you have any issues with this.

## 2018-12-26 NOTE — Progress Notes (Signed)
    Theresa Merritt 1961-08-14 [id number]        58 y.o.  G3P3 for annual gynecologic exam.  Complaining of hot flushes and sweats at night waking her up.  Had been on a bioequivalent hormone replacement regiment but stopped this over a year ago.  No bleeding history.  Past medical history,surgical history, problem list, medications, allergies, family history and social history were all reviewed and documented as reviewed in the EPIC chart.  ROS:  Performed with pertinent positives and negatives included in the history, assessment and plan.   Additional significant findings : None   Exam: Caryn Bee assistant Vitals:   12/26/18 1155  BP: 118/78  Weight: 206 lb (93.4 kg)  Height: 5\' 6"  (1.676 m)   Body mass index is 33.25 kg/m.  General appearance:  Normal affect, orientation and appearance. Skin: Grossly normal HEENT: Without gross lesions.  No cervical or supraclavicular adenopathy. Thyroid normal.  Lungs:  Clear without wheezing, rales or rhonchi Cardiac: RR, without RMG Abdominal:  Soft, nontender, without masses, guarding, rebound, organomegaly or hernia Breasts:  Examined lying and sitting without masses, retractions, discharge or axillary adenopathy. Pelvic:  Ext, BUS, Vagina: With atrophic changes  Cervix: With atrophic changes  Uterus: Anteverted, normal size, shape and contour, midline and mobile nontender   Adnexa: Without masses or tenderness    Anus and perineum: Normal   Rectovaginal: Normal sphincter tone without palpated masses or tenderness.    Assessment/Plan:  58 y.o. G3P3 female for annual gynecologic exam.  1. Postmenopausal/menopausal symptoms.  Had been on bioequivalent HRT regiment but stopped over a year ago.  For the last 6 months or so is having hot flushes and sweats at night which is waking her up.  Has lost 6 pounds from last year.  No hair or skin changes.  Will check baseline TSH.  Discussed treatment for menopausal symptoms to include  observation, OTC products and HRT.  The risks and benefits of HRT to include symptom relief possible cardiovascular and bone health versus risks to include thrombosis such as stroke heart attack DVT and breast cancer risks all discussed.  Options for replacement to include transdermal versus oral reviewed.  Benefits of transdermal form of thrombosis standpoint discussed.  After lengthy discussion the patient wants to go ahead and start on HRT.  She prefers an oral route and will start with estradiol 0.5 mg and Prometrium 100 mg nightly.  Patient will call if she does not notice a satisfactory relief of her symptoms or if she does any bleeding. 2. Pap smear/HPV 10/2015.  No Pap smear done today.  No history of significant abnormal Pap smears.  Plan repeat Pap smear/HPV at 5-year interval per current screening guidelines. 3. Mammography 10/2018.  Continue with annual mammography when due.  Breast exam normal today. 4. Colonoscopy 2019.  Repeat at their recommended interval. 5. Health maintenance.  No routine lab work done as patient reports this done elsewhere.  Follow-up in 1 year for annual exam, sooner if any issues with the HRT.   Anastasio Auerbach MD, 12:21 PM 12/26/2018

## 2019-09-04 ENCOUNTER — Encounter: Payer: Self-pay | Admitting: Gynecology

## 2019-11-06 ENCOUNTER — Encounter: Payer: Self-pay | Admitting: Gynecology

## 2020-01-03 ENCOUNTER — Telehealth: Payer: Self-pay | Admitting: *Deleted

## 2020-01-03 NOTE — Telephone Encounter (Signed)
Left message for patient to call and schedule her wellness exam.

## 2020-01-03 NOTE — Telephone Encounter (Signed)
Patient called requesting refill on HRT estradiol 0.5 mg and Prometrium 100 capsule. Needs to schedule annual exam, will route to appointment desk to schedule.

## 2020-01-04 ENCOUNTER — Other Ambulatory Visit: Payer: Self-pay

## 2020-01-04 MED ORDER — PROGESTERONE MICRONIZED 100 MG PO CAPS: 100.0000 mg | ORAL_CAPSULE | Freq: Every day | ORAL | 0 refills | Status: DC

## 2020-01-04 MED ORDER — ESTRADIOL 0.5 MG PO TABS: 0.5000 mg | ORAL_TABLET | Freq: Every day | ORAL | 0 refills | Status: DC

## 2020-01-04 NOTE — Telephone Encounter (Signed)
Annual exam scheduled on 02/14/20. Rx sent.

## 2020-02-04 ENCOUNTER — Telehealth: Payer: Self-pay | Admitting: *Deleted

## 2020-02-04 MED ORDER — PROGESTERONE MICRONIZED 100 MG PO CAPS: 100.0000 mg | ORAL_CAPSULE | Freq: Every day | ORAL | 1 refills | Status: DC

## 2020-02-04 MED ORDER — ESTRADIOL 0.5 MG PO TABS: 0.5000 mg | ORAL_TABLET | Freq: Every day | ORAL | 1 refills | Status: DC

## 2020-02-04 NOTE — Telephone Encounter (Signed)
Patient called requesting refill on HRT estradiol 0.5 mg tablet and progesterone 100mg  cap. Patient reschedule previous appointment due to sick mother. Now schedule for annual exam on 03/06/20. Rx sent.

## 2020-02-14 ENCOUNTER — Encounter: Payer: BLUE CROSS/BLUE SHIELD | Admitting: Obstetrics and Gynecology

## 2020-03-06 ENCOUNTER — Other Ambulatory Visit: Payer: Self-pay

## 2020-03-06 ENCOUNTER — Ambulatory Visit: Payer: BC Managed Care – PPO | Admitting: Obstetrics and Gynecology

## 2020-03-06 ENCOUNTER — Encounter: Payer: Self-pay | Admitting: Obstetrics and Gynecology

## 2020-03-06 VITALS — BP 128/84 | Ht 66.0 in | Wt 218.0 lb

## 2020-03-06 DIAGNOSIS — Z01419 Encounter for gynecological examination (general) (routine) without abnormal findings: Secondary | ICD-10-CM | POA: Diagnosis not present

## 2020-03-06 DIAGNOSIS — Z79890 Hormone replacement therapy (postmenopausal): Secondary | ICD-10-CM | POA: Diagnosis not present

## 2020-03-06 DIAGNOSIS — Z1151 Encounter for screening for human papillomavirus (HPV): Secondary | ICD-10-CM

## 2020-03-06 DIAGNOSIS — N951 Menopausal and female climacteric states: Secondary | ICD-10-CM

## 2020-03-06 DIAGNOSIS — Z124 Encounter for screening for malignant neoplasm of cervix: Secondary | ICD-10-CM

## 2020-03-06 MED ORDER — ESTRADIOL 0.5 MG PO TABS: 0.5000 mg | ORAL_TABLET | Freq: Every day | ORAL | 4 refills | Status: DC

## 2020-03-06 MED ORDER — PROGESTERONE MICRONIZED 100 MG PO CAPS: 100.0000 mg | ORAL_CAPSULE | Freq: Every day | ORAL | 4 refills | Status: AC

## 2020-03-06 NOTE — Progress Notes (Signed)
Theresa Merritt 07-10-1961 [id number]  SUBJECTIVE:  59 y.o. G3P3 female for annual routine gynecologic exam and Pap smear. She has no gynecologic concerns.  Current Outpatient Medications  Medication Sig Dispense Refill  . estradiol (ESTRACE) 0.5 MG tablet Take 1 tablet (0.5 mg total) by mouth daily. 30 tablet 1  . nitrofurantoin (MACRODANTIN) 100 MG capsule Take 100 mg by mouth as needed.    . progesterone (PROMETRIUM) 100 MG capsule Take 1 capsule (100 mg total) by mouth at bedtime. 30 capsule 1  . sertraline (ZOLOFT) 100 MG tablet Take 100 mg by mouth daily.    . traZODone (DESYREL) 100 MG tablet Take 100 mg by mouth at bedtime.    Marland Kitchen UNABLE TO FIND Testosterone injection     Current Facility-Administered Medications  Medication Dose Route Frequency Provider Last Rate Last Admin  . 0.9 %  sodium chloride infusion  500 mL Intravenous Once Gatha Mayer, MD       Allergies: Patient has no known allergies.  Patient's last menstrual period was 07/23/2015.  Past medical history,surgical history, problem list, medications, allergies, family history and social history were all reviewed and documented as reviewed in the EPIC chart.  ROS:  Feeling well. No dyspnea or chest pain on exertion.  No abdominal pain, change in bowel habits, black or bloody stools.  No urinary tract symptoms. GYN ROS: no abnormal bleeding, pelvic pain or discharge, no breast pain or new or enlarging lumps on self exam. No neurological complaints.   OBJECTIVE:  BP 128/84   Ht 5\' 6"  (1.676 m)   Wt 218 lb (98.9 kg)   LMP 07/23/2015   BMI 35.19 kg/m  The patient appears well, alert, oriented x 3, in no distress. ENT normal.  Neck supple. No cervical or supraclavicular adenopathy or thyromegaly.  Lungs are clear, good air entry, no wheezes, rhonchi or rales. S1 and S2 normal, no murmurs, regular rate and rhythm.  Abdomen soft without tenderness, guarding, mass or organomegaly.  Neurological is normal, no focal  findings.  BREAST EXAM: breasts appear normal, no suspicious masses, no skin or nipple changes or axillary nodes  PELVIC EXAM: VULVA: normal appearing vulva with no masses, tenderness or lesions, VAGINA: normal appearing vagina with normal color and discharge, no lesions, CERVIX: normal appearing cervix without discharge or lesions, UTERUS: uterus is normal size, shape, consistency and nontender, ADNEXA: normal adnexa in size, nontender and no masses, PAP: Pap smear done today, thin-prep method  Chaperone: Caryn Bee present during the examination  ASSESSMENT:  59 y.o. G3P3 here for annual gynecologic exam  PLAN:   1. Postmenopausal/HRT.  Hot flashes have greatly decreased since starting HRT and symptoms are much more tolerable.  She would like to continue understanding the risks of therapy to include thrombotic diseases such as heart attack, stroke, DVT, PE, uterine cancer, breast cancer.  Refill 0.5 mg estradiol daily and 100 mg Prometrium nightly x1 year. 2. Pap smear 10/2015.  No significant history of abnormal Pap smears.  Smear and HPV cotest obtained today. 3. Mammogram 11/2019.  Normal breast exam today.  She is reminded to schedule an annual mammogram this year when due. 4. Colonoscopy 2019.  Recommended that she follow up at the recommended interval.   5. DEXA never.  We will plan when further into menopause. 6. Health maintenance.  No labs today as she normally has these completed with her primary care provider.   Return annually or sooner, prn.  Joseph Pierini MD, [name]  03/06/20 

## 2020-03-06 NOTE — Addendum Note (Signed)
Addended by: Nelva Nay on: 03/06/2020 02:34 PM   Modules accepted: Orders

## 2020-03-07 LAB — PAP IG AND HPV HIGH-RISK: HPV DNA High Risk: NOT DETECTED

## 2020-04-02 ENCOUNTER — Other Ambulatory Visit: Payer: Self-pay | Admitting: Obstetrics and Gynecology

## 2020-04-08 ENCOUNTER — Other Ambulatory Visit: Payer: BLUE CROSS/BLUE SHIELD

## 2020-04-08 ENCOUNTER — Other Ambulatory Visit: Payer: Self-pay | Admitting: Physical Medicine and Rehabilitation

## 2020-04-08 ENCOUNTER — Other Ambulatory Visit: Payer: Self-pay

## 2020-04-08 ENCOUNTER — Ambulatory Visit
Admission: RE | Admit: 2020-04-08 | Discharge: 2020-04-08 | Disposition: A | Discharge status: Home / Self Care | Payer: Self-pay | Source: Ambulatory Visit | Attending: Physical Medicine and Rehabilitation | Admitting: Physical Medicine and Rehabilitation

## 2020-04-08 DIAGNOSIS — R52 Pain, unspecified: Secondary | ICD-10-CM

## 2020-04-08 DIAGNOSIS — M25561 Pain in right knee: Secondary | ICD-10-CM

## 2020-04-08 DIAGNOSIS — M25562 Pain in left knee: Secondary | ICD-10-CM

## 2020-11-26 ENCOUNTER — Encounter: Payer: Self-pay | Admitting: Obstetrics and Gynecology

## 2021-03-12 ENCOUNTER — Other Ambulatory Visit: Payer: Self-pay | Admitting: *Deleted

## 2021-03-12 NOTE — Telephone Encounter (Signed)
Patient overdue for Annual exam. Last exam 03/06/2020 with Dr. Delilah Shan. Will send refill request to Marny Lowenstein NP to approve or deny.

## 2021-03-13 MED ORDER — PROGESTERONE MICRONIZED 100 MG PO CAPS: ORAL_CAPSULE | ORAL | 0 refills | Status: DC

## 2021-03-26 NOTE — Progress Notes (Deleted)
60 y.o. G3P3 Married White or Caucasian female here for annual exam.      Patient's last menstrual period was 07/23/2015.            Sexually active: {yes no:314532}  The current method of family planning is post menopausal status.    Exercising: {yes no:314532}  {types:19826} Smoker:  {YES NO:22349}  Health Maintenance: Pap:  03-06-2020 neg HPV HR neg History of abnormal Pap:  {YES NO:22349} MMG:  11-06-2019 category b density birads 1:neg Colonoscopy:  2019 BMD:   none Gardasil:   *** Covid-19: *** Hep C testing: *** Screening Labs: ***   reports that she quit smoking about 41 years ago. Her smoking use included cigarettes. She has never used smokeless tobacco. She reports current alcohol use. She reports that she does not use drugs.  Past Medical History:  Diagnosis Date  . Allergy   . Anxiety   . Asthma    as child  . Depression   . Environmental allergies   . Personal history of colonic adenoma 05/24/2013  . Urinary incontinence     Past Surgical History:  Procedure Laterality Date  . CESAREAN SECTION     x2  . COLONOSCOPY    . Her Option Ablation  12/2007  . MENISCUS REPAIR      Current Outpatient Medications  Medication Sig Dispense Refill  . estradiol (ESTRACE) 0.5 MG tablet TAKE 1 TABLET(0.5 MG) BY MOUTH DAILY 30 tablet 11  . nitrofurantoin (MACRODANTIN) 100 MG capsule Take 100 mg by mouth as needed.    . progesterone (PROMETRIUM) 100 MG capsule Take 1 capsule (100 mg total) by mouth at bedtime. 90 capsule 4  . progesterone (PROMETRIUM) 100 MG capsule TAKE 1 CAPSULE(100 MG) BY MOUTH AT BEDTIME 30 capsule 0  . sertraline (ZOLOFT) 100 MG tablet Take 100 mg by mouth daily.    . traZODone (DESYREL) 100 MG tablet Take 100 mg by mouth at bedtime.    Marland Kitchen UNABLE TO FIND Testosterone injection     Current Facility-Administered Medications  Medication Dose Route Frequency Provider Last Rate Last Admin  . 0.9 %  sodium chloride infusion  500 mL Intravenous Once  Gatha Mayer, MD        Family History  Problem Relation Age of Onset  . Hypertension Mother   . Heart disease Father   . Colon cancer Neg Hx   . Esophageal cancer Neg Hx   . Rectal cancer Neg Hx   . Stomach cancer Neg Hx     Review of Systems  Exam:   LMP 07/23/2015      General appearance: alert, cooperative and appears stated age, no acute distress Head: Normocephalic, without obvious abnormality Neck: no adenopathy, thyroid {EXAM; THYROID:18604} Lungs: clear to auscultation bilaterally Breasts: {Exam; breast:13139::"normal appearance, no masses or tenderness"} Heart: regular rate and rhythm Abdomen: soft, non-tender; no masses,  no organomegaly Extremities: extremities normal, no edema Skin: No rashes or lesions Lymph nodes: Cervical, supraclavicular, and axillary nodes normal. No abnormal inguinal nodes palpated Neurologic: Grossly normal   Pelvic: External genitalia:  no lesions              Urethra:  normal appearing urethra with no masses, tenderness or lesions              Bartholins and Skenes: normal                 Vagina: normal appearing vagina, appropriate for age, normal appearing discharge, no lesions  Cervix: neg cervical motion tenderness, no visible lesions             Bimanual Exam:   Uterus:  {exam; uterus:12215}              Adnexa: {exam; adnexa:12223}                 ***, CMA Chaperone was present for exam.  A:  Well Woman with normal exam  P:   Pap :  Mammogram:  Labs:  Medications:

## 2021-03-30 ENCOUNTER — Ambulatory Visit: Payer: BC Managed Care – PPO | Admitting: Nurse Practitioner

## 2021-03-30 NOTE — Progress Notes (Signed)
60 y.o. G3P3 Married White or Caucasian female here for annual exam.   Currently on HRT, on testosterone injections, estrogen 0.5 mg oral, prometrium 100mg  The goal of therapy is specifically to help with sex drive/comfort, which is important to her. She wants to continue.  Receives Rx from PCP for testosterone  Has never had hot flashes but has had night sweats, wants to prevent   Sometimes has urinary incontinence associated with exercise, not that big of a problem for her at this time.  Has an appointment with urologist in 2 weeks r/t macrodantin Rx, requesting refill for a few pill until can get to her appointment   Patient's last menstrual period was 07/23/2015.          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    The current method of family planning is post menopausal status.    Exercising: Yes.     Smoker:  no  Health Maintenance: Pap:  03-06-2020 neg HPV HR neg History of abnormal Pap:  no MMG:  Pt believes up to date, will call [facility] for report Colonoscopy:  2019 BMD:   Not done Gardasil:   n/a Covid-19: pfizer Hep C testing: unsure Screening Labs: with PCP   reports that she quit smoking about 41 years ago. Her smoking use included cigarettes. She has never used smokeless tobacco. She reports current alcohol use. She reports that she does not use drugs.  Past Medical History:  Diagnosis Date  . Allergy   . Anxiety   . Asthma    as child  . Depression   . Environmental allergies   . Personal history of colonic adenoma 05/24/2013  . Urinary incontinence     Past Surgical History:  Procedure Laterality Date  . CESAREAN SECTION     x2  . COLONOSCOPY    . Her Option Ablation  12/2007  . MENISCUS REPAIR     bilateral    Current Outpatient Medications  Medication Sig Dispense Refill  . Chlorpheniramine Maleate (ALLERGY PO) Take by mouth.    . estradiol (ESTRACE) 0.5 MG tablet TAKE 1 TABLET(0.5 MG) BY MOUTH DAILY 30 tablet 11  .  nitrofurantoin (MACRODANTIN) 100 MG capsule Take 100 mg by mouth as needed.    . progesterone (PROMETRIUM) 100 MG capsule Take 1 capsule (100 mg total) by mouth at bedtime. 90 capsule 4  . sertraline (ZOLOFT) 100 MG tablet Take 100 mg by mouth daily. Takes 2    . traZODone (DESYREL) 150 MG tablet SMARTSIG:2/3-1 Tablet(s) By Mouth Every Night    . UNABLE TO FIND Testosterone injection     Current Facility-Administered Medications  Medication Dose Route Frequency Provider Last Rate Last Admin  . 0.9 %  sodium chloride infusion  500 mL Intravenous Once Gatha Mayer, MD        Family History  Problem Relation Age of Onset  . Hypertension Mother   . Heart disease Father   . Colon cancer Neg Hx   . Esophageal cancer Neg Hx   . Rectal cancer Neg Hx   . Stomach cancer Neg Hx     Review of Systems  Constitutional: Negative.   HENT: Negative.   Eyes: Negative.   Respiratory: Negative.   Cardiovascular: Negative.   Gastrointestinal: Negative.   Endocrine: Negative.   Genitourinary: Negative.   Musculoskeletal: Negative.   Skin: Negative.   Allergic/Immunologic: Negative.   Neurological: Negative.   Hematological: Negative.   Psychiatric/Behavioral:  Negative.     Exam:   BP 122/74   Pulse 84   Resp 16   Ht 5' 6.25" (1.683 m)   Wt 213 lb (96.6 kg)   LMP 07/23/2015   BMI 34.12 kg/m   Height: 5' 6.25" (168.3 cm)  General appearance: alert, cooperative and appears stated age, no acute distress Head: Normocephalic, without obvious abnormality Neck: no adenopathy, thyroid normal to inspection and palpation Lungs: clear to auscultation bilaterally Breasts: No axillary or supraclavicular adenopathy, Normal to palpation without dominant masses Heart: regular rate and rhythm Abdomen: soft, non-tender; no masses,  no organomegaly Extremities: extremities normal, no edema Skin: No rashes or lesions Lymph nodes: Cervical, supraclavicular, and axillary nodes normal. No abnormal  inguinal nodes palpated Neurologic: Grossly normal   Pelvic: External genitalia:  no lesions              Urethra:  normal appearing urethra with no masses, tenderness or lesions              Bartholins and Skenes: normal                 Vagina: normal appearing vagina, appropriate for age, normal appearing discharge, no lesions              Cervix: neg cervical motion tenderness, no visible lesions             Bimanual Exam:   Uterus:  normal size, contour, position, consistency, mobility, non-tender              Adnexa: no mass, fullness, tenderness                 Joy, CMA Chaperone was present for exam.  A:   Well woman exam with routine gynecological exam  Menopausal symptom - Plan: estradiol (VIVELLE-DOT) 0.025 MG/24HR, progesterone (PROMETRIUM) 100 MG capsule  Frequent UTI - Plan: nitrofurantoin (MACRODANTIN) 100 MG capsule  Discussed estrogen therapy, lowest dose for shortest amount of time. Will switch to patch to reduce risk of DVT  Stress incontinence  P:   Pap : last done 2021, next due 2026  Mammogram: will [facility] to get [facility]: gets done with PCP  Medications: as above  Discussed Kegel exercise to help with stress incontinence

## 2021-04-02 ENCOUNTER — Other Ambulatory Visit: Payer: Self-pay

## 2021-04-02 ENCOUNTER — Encounter: Payer: Self-pay | Admitting: Nurse Practitioner

## 2021-04-02 ENCOUNTER — Ambulatory Visit (INDEPENDENT_AMBULATORY_CARE_PROVIDER_SITE_OTHER): Payer: BC Managed Care – PPO | Admitting: Nurse Practitioner

## 2021-04-02 VITALS — BP 122/74 | HR 84 | Resp 16 | Ht 66.25 in | Wt 213.0 lb

## 2021-04-02 DIAGNOSIS — Z01419 Encounter for gynecological examination (general) (routine) without abnormal findings: Secondary | ICD-10-CM | POA: Diagnosis not present

## 2021-04-02 DIAGNOSIS — N951 Menopausal and female climacteric states: Secondary | ICD-10-CM

## 2021-04-02 DIAGNOSIS — N390 Urinary tract infection, site not specified: Secondary | ICD-10-CM | POA: Diagnosis not present

## 2021-04-02 MED ORDER — ESTRADIOL 0.025 MG/24HR TD PTTW: 1.0000 | MEDICATED_PATCH | TRANSDERMAL | 12 refills | Status: DC

## 2021-04-02 MED ORDER — PROGESTERONE MICRONIZED 100 MG PO CAPS: 100.0000 mg | ORAL_CAPSULE | Freq: Every day | ORAL | 12 refills | Status: DC

## 2021-04-02 MED ORDER — NITROFURANTOIN MACROCRYSTAL 100 MG PO CAPS: ORAL_CAPSULE | ORAL | 0 refills | Status: DC

## 2021-04-02 NOTE — Patient Instructions (Signed)
Health Maintenance for Postmenopausal Women Menopause is a normal process in which your ability to get pregnant comes to an end. This process happens slowly over many months or years, usually between the ages of 48 and 55. Menopause is complete when you have missed your menstrual periods for 12 months. It is important to talk with your health care provider about some of the most common conditions that affect women after menopause (postmenopausal women). These include heart disease, cancer, and bone loss (osteoporosis). Adopting a healthy lifestyle and getting preventive care can help to promote your health and wellness. The actions you take can also lower your chances of developing some of these common conditions. What should I know about menopause? During menopause, you may get a number of symptoms, such as:  Hot flashes. These can be moderate or severe.  Night sweats.  Decrease in sex drive.  Mood swings.  Headaches.  Tiredness.  Irritability.  Memory problems.  Insomnia. Choosing to treat or not to treat these symptoms is a decision that you make with your health care provider. Do I need hormone replacement therapy?  Hormone replacement therapy is effective in treating symptoms that are caused by menopause, such as hot flashes and night sweats.  Hormone replacement carries certain risks, especially as you become older. If you are thinking about using estrogen or estrogen with progestin, discuss the benefits and risks with your health care provider. What is my risk for heart disease and stroke? The risk of heart disease, heart attack, and stroke increases as you age. One of the causes may be a change in the body's hormones during menopause. This can affect how your body uses dietary fats, triglycerides, and cholesterol. Heart attack and stroke are medical emergencies. There are many things that you can do to help prevent heart disease and stroke. Watch your blood pressure  High  blood pressure causes heart disease and increases the risk of stroke. This is more likely to develop in people who have high blood pressure readings, are of African descent, or are overweight.  Have your blood pressure checked: ? Every 3-5 years if you are 18-39 years of age. ? Every year if you are 40 years old or older. Eat a healthy diet  Eat a diet that includes plenty of vegetables, fruits, low-fat dairy products, and lean protein.  Do not eat a lot of foods that are high in solid fats, added sugars, or sodium.   Get regular exercise Get regular exercise. This is one of the most important things you can do for your health. Most adults should:  Try to exercise for at least 150 minutes each week. The exercise should increase your heart rate and make you sweat (moderate-intensity exercise).  Try to do strengthening exercises at least twice each week. Do these in addition to the moderate-intensity exercise.  Spend less time sitting. Even light physical activity can be beneficial. Other tips  Work with your health care provider to achieve or maintain a healthy weight.  Do not use any products that contain nicotine or tobacco, such as cigarettes, e-cigarettes, and chewing tobacco. If you need help quitting, ask your health care provider.  Know your numbers. Ask your health care provider to check your cholesterol and your blood sugar (glucose). Continue to have your blood tested as directed by your health care provider. Do I need screening for cancer? Depending on your health history and family history, you may need to have cancer screening at different stages of your life.   This may include screening for:  Breast cancer.  Cervical cancer.  Lung cancer.  Colorectal cancer. What is my risk for osteoporosis? After menopause, you may be at increased risk for osteoporosis. Osteoporosis is a condition in which bone destruction happens more quickly than new bone creation. To help prevent  osteoporosis or the bone fractures that can happen because of osteoporosis, you may take the following actions:  If you are 25-68 years old, get at least 1,000 mg of calcium and at least 600 mg of vitamin D per day.  If you are older than age 15 but younger than age 46, get at least 1,200 mg of calcium and at least 600 mg of vitamin D per day.  If you are older than age 48, get at least 1,200 mg of calcium and at least 800 mg of vitamin D per day. Smoking and drinking excessive alcohol increase the risk of osteoporosis. Eat foods that are rich in calcium and vitamin D, and do weight-bearing exercises several times each week as directed by your health care provider. How does menopause affect my mental health? Depression may occur at any age, but it is more common as you become older. Common symptoms of depression include:  Low or sad mood.  Changes in sleep patterns.  Changes in appetite or eating patterns.  Feeling an overall lack of motivation or enjoyment of activities that you previously enjoyed.  Frequent crying spells. Talk with your health care provider if you think that you are experiencing depression. General instructions See your health care provider for regular wellness exams and vaccines. This may include:  Scheduling regular health, dental, and eye exams.  Getting and maintaining your vaccines. These include: ? Influenza vaccine. Get this vaccine each year before the flu season begins. ? Pneumonia vaccine. ? Shingles vaccine. ? Tetanus, diphtheria, and pertussis (Tdap) booster vaccine. Your health care provider may also recommend other immunizations. Tell your health care provider if you have ever been abused or do not feel safe at home. Summary  Menopause is a normal process in which your ability to get pregnant comes to an end.  This condition causes hot flashes, night sweats, decreased interest in sex, mood swings, headaches, or lack of sleep.  Treatment for this  condition may include hormone replacement therapy.  Take actions to keep yourself healthy, including exercising regularly, eating a healthy diet, watching your weight, and checking your blood pressure and blood sugar levels.  Get screened for cancer and depression. Make sure that you are up to date with all your vaccines. This information is not intended to replace advice given to you by your health care provider. Make sure you discuss any questions you have with your health care provider. Document Revised: 11/15/2018 Document Reviewed: 11/15/2018 Elsevier Patient Education  2021 [place]. Kegel Exercises  Kegel exercises can help strengthen your pelvic floor muscles. The pelvic floor is a group of muscles that support your rectum, small intestine, and bladder. In females, pelvic floor muscles also help support the womb (uterus). These muscles help you control the flow of urine and stool. Kegel exercises are painless and simple, and they do not require any equipment. Your provider may suggest Kegel exercises to:  Improve bladder and bowel control.  Improve sexual response.  Improve weak pelvic floor muscles after surgery to remove the uterus (hysterectomy) or pregnancy (females).  Improve weak pelvic floor muscles after prostate gland removal or surgery (males). Kegel exercises involve squeezing your pelvic floor muscles, which are  the same muscles you squeeze when you try to stop the flow of urine or keep from passing gas. The exercises can be done while sitting, standing, or lying down, but it is best to vary your position. Exercises How to do Kegel exercises: 1. Squeeze your pelvic floor muscles tight. You should feel a tight lift in your rectal area. If you are a female, you should also feel a tightness in your vaginal area. Keep your stomach, buttocks, and legs relaxed. 2. Hold the muscles tight for up to 10 seconds. 3. Breathe normally. 4. Relax your muscles. 5. Repeat as told  by your health care provider. Repeat this exercise daily as told by your health care provider. Continue to do this exercise for at least 4-6 weeks, or for as long as told by your health care provider. You may be referred to a physical therapist who can help you learn more about how to do Kegel exercises. Depending on your condition, your health care provider may recommend:  Varying how long you squeeze your muscles.  Doing several sets of exercises every day.  Doing exercises for several weeks.  Making Kegel exercises a part of your regular exercise routine. This information is not intended to replace advice given to you by your health care provider. Make sure you discuss any questions you have with your health care provider. Document Revised: 03/28/2020 Document Reviewed: 07/12/2018 Elsevier Patient Education  [place].

## 2021-07-24 ENCOUNTER — Telehealth: Payer: Self-pay | Admitting: *Deleted

## 2021-07-24 NOTE — Telephone Encounter (Signed)
Patient called with questions related to possible side effect of the estradiol patch.Patient has developed a rash on her face, pt states only change in her medication is when she switched from estradiol pill to patch. I will route this to Provider for recommendations.

## 2021-07-31 NOTE — Telephone Encounter (Signed)
Can stop the patch for a week and see if the rash goes away.  Then restart the patch and see if the rash comes back.  If it does, then recommend changing to another form of HRT.

## 2021-08-03 NOTE — Telephone Encounter (Signed)
Spoke with patient and informed her. °

## 2021-09-25 ENCOUNTER — Other Ambulatory Visit: Payer: Self-pay | Admitting: *Deleted

## 2021-09-25 DIAGNOSIS — N390 Urinary tract infection, site not specified: Secondary | ICD-10-CM

## 2021-09-25 MED ORDER — NITROFURANTOIN MACROCRYSTAL 100 MG PO CAPS: ORAL_CAPSULE | ORAL | 0 refills | Status: AC

## 2021-09-25 NOTE — Telephone Encounter (Signed)
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

## 2021-09-25 NOTE — Telephone Encounter (Signed)
Med refill request: nitrofurantion 100 mg caps. Take 1 cap after intercourse Last AEX: 04/02/21 with Karma Ganja, NP Next AEX: Not scheduled Last MMG: N/A    Call placed to patient, Left message to call [facility], [phone], OPT 4.   Is patient experiencing UTI symptoms currently?

## 2021-11-06 ENCOUNTER — Telehealth: Payer: Self-pay | Admitting: *Deleted

## 2021-11-06 DIAGNOSIS — N951 Menopausal and female climacteric states: Secondary | ICD-10-CM

## 2021-11-06 NOTE — Telephone Encounter (Signed)
Theresa Karma Ganja, Theresa Merritt patient. Called today has been off vivelle dot patch 0.025 mg tablet since August. She would prefer to go back on oral estradiol. Patient reports the patch caused rash fell off all the time. Last annual exam was 03/2021 Asked if this can be prescribed? Please advise

## 2021-11-09 ENCOUNTER — Other Ambulatory Visit: Payer: Self-pay

## 2021-11-12 MED ORDER — ESTRADIOL 0.5 MG PO TABS: 0.5000 mg | ORAL_TABLET | Freq: Every day | ORAL | 4 refills | Status: AC

## 2021-11-12 MED ORDER — PROGESTERONE MICRONIZED 100 MG PO CAPS: ORAL_CAPSULE | ORAL | 4 refills | Status: AC

## 2021-11-12 NOTE — Telephone Encounter (Signed)
Theresa Bruins, MD  Kelvin Cellar L, RMA 19 hours ago (3:33 PM)   Needs Prometrium 100 mg PO HS as well.    Theresa Bruins, MD  Thamas Jaegers, RMA 19 hours ago (3:31 PM)   Agree to prescribe Estradiol 0.5 mg 1 tab PO daily

## 2021-11-12 NOTE — Telephone Encounter (Signed)
Spoke with patient and informed her. She has Prometrium on hand and understood the importance of taking it while taking estradiol tabs.  I resent the Rx with refills til April for both. Her AEX is due in April 2023.

## 2021-12-04 ENCOUNTER — Encounter: Payer: Self-pay | Admitting: Internal Medicine

## 2022-02-16 ENCOUNTER — Other Ambulatory Visit: Payer: Self-pay | Admitting: Obstetrics & Gynecology

## 2022-12-03 ENCOUNTER — Ambulatory Visit
Admission: RE | Admit: 2022-12-03 | Discharge: 2022-12-03 | Disposition: A | Discharge status: Home / Self Care | Payer: BC Managed Care – PPO | Source: Ambulatory Visit | Attending: Family Medicine | Admitting: Family Medicine

## 2022-12-03 ENCOUNTER — Other Ambulatory Visit: Payer: Self-pay | Admitting: Family Medicine

## 2022-12-03 DIAGNOSIS — M79672 Pain in left foot: Secondary | ICD-10-CM
